# Patient Record
Sex: Male | Born: 1945
Health system: Southern US, Community
[De-identification: ages and names within clinical notes are randomized; demographics above are authoritative.]

## PROBLEM LIST (undated history)

## (undated) DIAGNOSIS — M1990 Unspecified osteoarthritis, unspecified site: Secondary | ICD-10-CM

## (undated) DIAGNOSIS — G4762 Sleep related leg cramps: Secondary | ICD-10-CM

## (undated) DIAGNOSIS — Z85828 Personal history of other malignant neoplasm of skin: Secondary | ICD-10-CM

## (undated) DIAGNOSIS — C801 Malignant (primary) neoplasm, unspecified: Secondary | ICD-10-CM

## (undated) DIAGNOSIS — M109 Gout, unspecified: Secondary | ICD-10-CM

## (undated) HISTORY — PX: JOINT REPLACEMENT: SHX530

---

## 1991-10-08 HISTORY — PX: KNEE ARTHROSCOPY: SUR90

## 1991-10-08 HISTORY — PX: ANKLE ARTHROTOMY: SUR101

## 2012-08-18 DIAGNOSIS — Z23 Encounter for immunization: Secondary | ICD-10-CM | POA: Diagnosis not present

## 2013-01-25 DIAGNOSIS — L821 Other seborrheic keratosis: Secondary | ICD-10-CM | POA: Diagnosis not present

## 2013-01-25 DIAGNOSIS — L910 Hypertrophic scar: Secondary | ICD-10-CM | POA: Diagnosis not present

## 2013-01-25 DIAGNOSIS — C44611 Basal cell carcinoma of skin of unspecified upper limb, including shoulder: Secondary | ICD-10-CM | POA: Diagnosis not present

## 2013-01-25 DIAGNOSIS — D485 Neoplasm of uncertain behavior of skin: Secondary | ICD-10-CM | POA: Diagnosis not present

## 2013-01-25 DIAGNOSIS — L570 Actinic keratosis: Secondary | ICD-10-CM | POA: Diagnosis not present

## 2013-02-18 DIAGNOSIS — C44519 Basal cell carcinoma of skin of other part of trunk: Secondary | ICD-10-CM | POA: Diagnosis not present

## 2013-02-18 DIAGNOSIS — D485 Neoplasm of uncertain behavior of skin: Secondary | ICD-10-CM | POA: Diagnosis not present

## 2013-02-18 DIAGNOSIS — C4441 Basal cell carcinoma of skin of scalp and neck: Secondary | ICD-10-CM | POA: Diagnosis not present

## 2013-02-18 DIAGNOSIS — C44611 Basal cell carcinoma of skin of unspecified upper limb, including shoulder: Secondary | ICD-10-CM | POA: Diagnosis not present

## 2013-03-22 DIAGNOSIS — S51809A Unspecified open wound of unspecified forearm, initial encounter: Secondary | ICD-10-CM | POA: Diagnosis not present

## 2013-03-22 DIAGNOSIS — R030 Elevated blood-pressure reading, without diagnosis of hypertension: Secondary | ICD-10-CM | POA: Diagnosis not present

## 2013-04-07 DIAGNOSIS — C4441 Basal cell carcinoma of skin of scalp and neck: Secondary | ICD-10-CM | POA: Diagnosis not present

## 2013-04-07 DIAGNOSIS — L821 Other seborrheic keratosis: Secondary | ICD-10-CM | POA: Diagnosis not present

## 2013-07-02 DIAGNOSIS — Z23 Encounter for immunization: Secondary | ICD-10-CM | POA: Diagnosis not present

## 2013-09-16 DIAGNOSIS — L570 Actinic keratosis: Secondary | ICD-10-CM | POA: Diagnosis not present

## 2013-09-16 DIAGNOSIS — D485 Neoplasm of uncertain behavior of skin: Secondary | ICD-10-CM | POA: Diagnosis not present

## 2013-09-16 DIAGNOSIS — C44611 Basal cell carcinoma of skin of unspecified upper limb, including shoulder: Secondary | ICD-10-CM | POA: Diagnosis not present

## 2013-09-16 DIAGNOSIS — Z85828 Personal history of other malignant neoplasm of skin: Secondary | ICD-10-CM | POA: Diagnosis not present

## 2013-09-16 DIAGNOSIS — L259 Unspecified contact dermatitis, unspecified cause: Secondary | ICD-10-CM | POA: Diagnosis not present

## 2013-09-16 DIAGNOSIS — C44519 Basal cell carcinoma of skin of other part of trunk: Secondary | ICD-10-CM | POA: Diagnosis not present

## 2013-10-12 DIAGNOSIS — H0230 Blepharochalasis unspecified eye, unspecified eyelid: Secondary | ICD-10-CM | POA: Diagnosis not present

## 2013-10-12 DIAGNOSIS — H04129 Dry eye syndrome of unspecified lacrimal gland: Secondary | ICD-10-CM | POA: Diagnosis not present

## 2013-10-12 DIAGNOSIS — H2510 Age-related nuclear cataract, unspecified eye: Secondary | ICD-10-CM | POA: Diagnosis not present

## 2013-10-12 DIAGNOSIS — H1045 Other chronic allergic conjunctivitis: Secondary | ICD-10-CM | POA: Diagnosis not present

## 2013-11-09 DIAGNOSIS — C44611 Basal cell carcinoma of skin of unspecified upper limb, including shoulder: Secondary | ICD-10-CM | POA: Diagnosis not present

## 2013-11-09 DIAGNOSIS — C4491 Basal cell carcinoma of skin, unspecified: Secondary | ICD-10-CM | POA: Diagnosis not present

## 2013-11-09 DIAGNOSIS — C44519 Basal cell carcinoma of skin of other part of trunk: Secondary | ICD-10-CM | POA: Diagnosis not present

## 2014-05-31 DIAGNOSIS — M1610 Unilateral primary osteoarthritis, unspecified hip: Secondary | ICD-10-CM | POA: Diagnosis not present

## 2014-05-31 DIAGNOSIS — R109 Unspecified abdominal pain: Secondary | ICD-10-CM | POA: Diagnosis not present

## 2014-05-31 DIAGNOSIS — M25559 Pain in unspecified hip: Secondary | ICD-10-CM | POA: Diagnosis not present

## 2014-06-21 DIAGNOSIS — C44529 Squamous cell carcinoma of skin of other part of trunk: Secondary | ICD-10-CM | POA: Diagnosis not present

## 2014-06-21 DIAGNOSIS — C44611 Basal cell carcinoma of skin of unspecified upper limb, including shoulder: Secondary | ICD-10-CM | POA: Diagnosis not present

## 2014-06-21 DIAGNOSIS — D485 Neoplasm of uncertain behavior of skin: Secondary | ICD-10-CM | POA: Diagnosis not present

## 2014-06-21 DIAGNOSIS — C44319 Basal cell carcinoma of skin of other parts of face: Secondary | ICD-10-CM | POA: Diagnosis not present

## 2014-06-21 DIAGNOSIS — C44221 Squamous cell carcinoma of skin of unspecified ear and external auricular canal: Secondary | ICD-10-CM | POA: Diagnosis not present

## 2014-06-21 DIAGNOSIS — L821 Other seborrheic keratosis: Secondary | ICD-10-CM | POA: Diagnosis not present

## 2014-06-21 DIAGNOSIS — Z85828 Personal history of other malignant neoplasm of skin: Secondary | ICD-10-CM | POA: Diagnosis not present

## 2014-07-04 DIAGNOSIS — M25559 Pain in unspecified hip: Secondary | ICD-10-CM | POA: Diagnosis not present

## 2014-07-04 DIAGNOSIS — R109 Unspecified abdominal pain: Secondary | ICD-10-CM | POA: Diagnosis not present

## 2014-07-04 DIAGNOSIS — M1610 Unilateral primary osteoarthritis, unspecified hip: Secondary | ICD-10-CM | POA: Diagnosis not present

## 2014-07-21 DIAGNOSIS — Z23 Encounter for immunization: Secondary | ICD-10-CM | POA: Diagnosis not present

## 2014-07-27 DIAGNOSIS — M1611 Unilateral primary osteoarthritis, right hip: Secondary | ICD-10-CM | POA: Diagnosis not present

## 2014-07-27 DIAGNOSIS — M1612 Unilateral primary osteoarthritis, left hip: Secondary | ICD-10-CM | POA: Diagnosis not present

## 2014-08-03 DIAGNOSIS — C44319 Basal cell carcinoma of skin of other parts of face: Secondary | ICD-10-CM | POA: Diagnosis not present

## 2014-08-03 DIAGNOSIS — C44310 Basal cell carcinoma of skin of unspecified parts of face: Secondary | ICD-10-CM | POA: Diagnosis not present

## 2014-08-09 DIAGNOSIS — C44222 Squamous cell carcinoma of skin of right ear and external auricular canal: Secondary | ICD-10-CM | POA: Diagnosis not present

## 2014-08-10 DIAGNOSIS — M1612 Unilateral primary osteoarthritis, left hip: Secondary | ICD-10-CM | POA: Diagnosis not present

## 2014-08-10 DIAGNOSIS — M25552 Pain in left hip: Secondary | ICD-10-CM | POA: Diagnosis not present

## 2014-08-17 DIAGNOSIS — C44529 Squamous cell carcinoma of skin of other part of trunk: Secondary | ICD-10-CM | POA: Diagnosis not present

## 2014-08-17 DIAGNOSIS — C44612 Basal cell carcinoma of skin of right upper limb, including shoulder: Secondary | ICD-10-CM | POA: Diagnosis not present

## 2014-08-22 DIAGNOSIS — M1990 Unspecified osteoarthritis, unspecified site: Secondary | ICD-10-CM | POA: Diagnosis not present

## 2014-08-22 DIAGNOSIS — M25551 Pain in right hip: Secondary | ICD-10-CM | POA: Diagnosis not present

## 2014-08-23 ENCOUNTER — Other Ambulatory Visit (HOSPITAL_COMMUNITY): Payer: Self-pay | Admitting: Orthopaedic Surgery

## 2014-08-26 ENCOUNTER — Other Ambulatory Visit (HOSPITAL_COMMUNITY): Payer: Self-pay | Admitting: *Deleted

## 2014-08-26 NOTE — Patient Instructions (Addendum)
Jose Robles  08/26/2014                           YOUR PROCEDURE IS SCHEDULED ON: 09/02/14                ENTER FROM [address] - GO TO PARKING DECK               LOOK FOR VALET PARKING  / [employer]ARTS                              FOLLOW  SIGNS TO SHORT STAY CENTER                 ARRIVE AT SHORT STAY AT: 7:45 AM               CALL THIS NUMBER IF ANY PROBLEMS THE DAY OF SURGERY :               [phone]                                REMEMBER:   Do not eat food or drink liquids AFTER MIDNIGHT                  Take these medicines the morning of surgery with               A SIPS OF WATER :   NONE      Do not wear jewelry, make-up   Do not wear lotions, powders, or perfumes.   Do not shave legs or underarms 12 hrs. before surgery (men may shave face)  Do not bring valuables to the hospital.  Contacts, dentures or bridgework may not be worn into surgery.  Leave suitcase in the car. After surgery it may be brought to your room.  For patients admitted to the hospital more than one night, checkout time is            11:00 AM                                                     ________________________________________________________________________                                                                                                  [place]  Before surgery, you can play an important role.  Because skin is not sterile, your skin needs to be as free of germs as possible.  You can reduce the number of germs on your skin by washing with CHG (chlorahexidine gluconate) soap before surgery.  CHG is an antiseptic cleaner which kills germs and bonds with the skin to continue killing germs even after washing. Please DO NOT use if you have an allergy to  CHG or antibacterial soaps.  If your skin becomes reddened/irritated stop using the CHG and inform your nurse when you arrive at Short Stay. Do not shave (including legs and underarms)  for at least 48 hours prior to the first CHG shower.  You may shave your face. Please follow these instructions carefully:   1.  Shower with CHG Soap the night before surgery and the  morning of Surgery.   2.  If you choose to wash your hair, wash your hair first as usual with your  normal  Shampoo.   3.  After you shampoo, rinse your hair and body thoroughly to remove the  shampoo.                                         4.  Use CHG as you would any other liquid soap.  You can apply chg directly  to the skin and wash . Gently wash with scrungie or clean wascloth    5.  Apply the CHG Soap to your body ONLY FROM THE NECK DOWN.   Do not use on open                           Wound or open sores. Avoid contact with eyes, ears mouth and genitals (private parts).                        Genitals (private parts) with your normal soap.              6.  Wash thoroughly, paying special attention to the area where your surgery  will be performed.   7.  Thoroughly rinse your body with warm water from the neck down.   8.  DO NOT shower/wash with your normal soap after using and rinsing off  the CHG Soap .                9.  Pat yourself dry with a clean towel.             10.  Wear clean pajamas.             11.  Place clean sheets on your bed the night of your first shower and do not  sleep with pets.  Day of Surgery : Do not apply any lotions/deodorants the morning of surgery.  Please wear clean clothes to the hospital/surgery center.  FAILURE TO FOLLOW THESE INSTRUCTIONS MAY RESULT IN THE CANCELLATION OF YOUR SURGERY    PATIENT SIGNATURE_________________________________  ______________________________________________________________________

## 2014-08-29 ENCOUNTER — Encounter (HOSPITAL_COMMUNITY): Payer: Self-pay

## 2014-08-29 ENCOUNTER — Encounter (HOSPITAL_COMMUNITY)
Admission: RE | Admit: 2014-08-29 | Discharge: 2014-08-29 | Disposition: A | Discharge status: Home / Self Care | Payer: Medicare Other | Source: Ambulatory Visit | Attending: Orthopaedic Surgery | Admitting: Orthopaedic Surgery

## 2014-08-29 DIAGNOSIS — Z01812 Encounter for preprocedural laboratory examination: Secondary | ICD-10-CM | POA: Diagnosis not present

## 2014-08-29 HISTORY — DX: Unspecified osteoarthritis, unspecified site: M19.90

## 2014-08-29 HISTORY — DX: Personal history of other malignant neoplasm of skin: Z85.828

## 2014-08-29 HISTORY — DX: Gout, unspecified: M10.9

## 2014-08-29 HISTORY — DX: Malignant (primary) neoplasm, unspecified: C80.1

## 2014-08-29 LAB — URINALYSIS, ROUTINE W REFLEX MICROSCOPIC
Bilirubin Urine: NEGATIVE
Glucose, UA: NEGATIVE mg/dL
Hgb urine dipstick: NEGATIVE
Ketones, ur: NEGATIVE mg/dL
LEUKOCYTES UA: NEGATIVE
Nitrite: NEGATIVE
PROTEIN: NEGATIVE mg/dL
Specific Gravity, Urine: 1.008 (ref 1.005–1.030)
Urobilinogen, UA: 0.2 mg/dL (ref 0.0–1.0)
pH: 6 (ref 5.0–8.0)

## 2014-08-29 LAB — PROTIME-INR
INR: 0.98 (ref 0.00–1.49)
PROTHROMBIN TIME: 13 s (ref 11.6–15.2)

## 2014-08-29 LAB — CBC
HCT: 44.4 % (ref 39.0–52.0)
Hemoglobin: 15.2 g/dL (ref 13.0–17.0)
MCH: 33.5 pg (ref 26.0–34.0)
MCHC: 34.2 g/dL (ref 30.0–36.0)
MCV: 97.8 fL (ref 78.0–100.0)
Platelets: 183 10*3/uL (ref 150–400)
RBC: 4.54 MIL/uL (ref 4.22–5.81)
RDW: 12.1 % (ref 11.5–15.5)
WBC: 9.2 10*3/uL (ref 4.0–10.5)

## 2014-08-29 LAB — BASIC METABOLIC PANEL
Anion gap: 12 (ref 5–15)
BUN: 12 mg/dL (ref 6–23)
CHLORIDE: 103 meq/L (ref 96–112)
CO2: 26 mEq/L (ref 19–32)
CREATININE: 0.87 mg/dL (ref 0.50–1.35)
Calcium: 9.9 mg/dL (ref 8.4–10.5)
GFR calc non Af Amer: 87 mL/min — ABNORMAL LOW (ref >90)
Glucose, Bld: 113 mg/dL — ABNORMAL HIGH (ref 70–99)
Potassium: 4.4 mEq/L (ref 3.7–5.3)
Sodium: 141 mEq/L (ref 137–147)

## 2014-08-29 LAB — SURGICAL PCR SCREEN
MRSA, PCR: NEGATIVE
STAPHYLOCOCCUS AUREUS: NEGATIVE

## 2014-08-29 LAB — APTT: APTT: 26 s (ref 24–37)

## 2014-08-29 NOTE — Progress Notes (Signed)
   08/29/14 1503  OBSTRUCTIVE SLEEP APNEA  Have you ever been diagnosed with sleep apnea through a sleep study? No  Do you snore loudly (loud enough to be heard through closed doors)?  1  Do you often feel tired, fatigued, or sleepy during the daytime? 1  Has anyone observed you stop breathing during your sleep? 0  Do you have, or are you being treated for high blood pressure? 0  BMI more than 35 kg/m2? 0  Age over 67 years old? 1  Neck circumference greater than 40 cm/16 inches? 1  Gender: 1  Obstructive Sleep Apnea Score 5  Score 4 or greater  Results sent to PCP

## 2014-09-02 ENCOUNTER — Inpatient Hospital Stay (HOSPITAL_COMMUNITY)
Admission: RE | Admit: 2014-09-02 | Discharge: 2014-09-04 | DRG: 462 | Disposition: A | Discharge status: Home Health | Payer: Medicare Other | Source: Ambulatory Visit | Attending: Orthopaedic Surgery | Admitting: Orthopaedic Surgery

## 2014-09-02 ENCOUNTER — Inpatient Hospital Stay (HOSPITAL_COMMUNITY): Payer: Medicare Other

## 2014-09-02 ENCOUNTER — Encounter (HOSPITAL_COMMUNITY)
Admission: RE | Disposition: A | Discharge status: Home Health | Payer: Self-pay | Source: Ambulatory Visit | Attending: Orthopaedic Surgery

## 2014-09-02 ENCOUNTER — Inpatient Hospital Stay (HOSPITAL_COMMUNITY): Payer: Medicare Other | Admitting: Anesthesiology

## 2014-09-02 ENCOUNTER — Encounter (HOSPITAL_COMMUNITY): Payer: Self-pay

## 2014-09-02 DIAGNOSIS — Z96641 Presence of right artificial hip joint: Secondary | ICD-10-CM | POA: Diagnosis not present

## 2014-09-02 DIAGNOSIS — Z87891 Personal history of nicotine dependence: Secondary | ICD-10-CM

## 2014-09-02 DIAGNOSIS — Z880 Allergy status to penicillin: Secondary | ICD-10-CM | POA: Diagnosis not present

## 2014-09-02 DIAGNOSIS — M169 Osteoarthritis of hip, unspecified: Secondary | ICD-10-CM | POA: Diagnosis not present

## 2014-09-02 DIAGNOSIS — M25752 Osteophyte, left hip: Secondary | ICD-10-CM | POA: Diagnosis present

## 2014-09-02 DIAGNOSIS — M109 Gout, unspecified: Secondary | ICD-10-CM | POA: Diagnosis present

## 2014-09-02 DIAGNOSIS — Z881 Allergy status to other antibiotic agents status: Secondary | ICD-10-CM | POA: Diagnosis not present

## 2014-09-02 DIAGNOSIS — Z471 Aftercare following joint replacement surgery: Secondary | ICD-10-CM | POA: Diagnosis not present

## 2014-09-02 DIAGNOSIS — Z96643 Presence of artificial hip joint, bilateral: Secondary | ICD-10-CM

## 2014-09-02 DIAGNOSIS — M160 Bilateral primary osteoarthritis of hip: Secondary | ICD-10-CM | POA: Diagnosis not present

## 2014-09-02 DIAGNOSIS — M25452 Effusion, left hip: Secondary | ICD-10-CM | POA: Diagnosis present

## 2014-09-02 DIAGNOSIS — Z01812 Encounter for preprocedural laboratory examination: Secondary | ICD-10-CM

## 2014-09-02 DIAGNOSIS — M1990 Unspecified osteoarthritis, unspecified site: Secondary | ICD-10-CM | POA: Diagnosis not present

## 2014-09-02 DIAGNOSIS — Z96642 Presence of left artificial hip joint: Secondary | ICD-10-CM | POA: Diagnosis not present

## 2014-09-02 DIAGNOSIS — G8918 Other acute postprocedural pain: Secondary | ICD-10-CM | POA: Diagnosis not present

## 2014-09-02 DIAGNOSIS — Z85828 Personal history of other malignant neoplasm of skin: Secondary | ICD-10-CM

## 2014-09-02 DIAGNOSIS — M25551 Pain in right hip: Secondary | ICD-10-CM | POA: Diagnosis not present

## 2014-09-02 DIAGNOSIS — M25552 Pain in left hip: Secondary | ICD-10-CM | POA: Diagnosis not present

## 2014-09-02 DIAGNOSIS — Z419 Encounter for procedure for purposes other than remedying health state, unspecified: Secondary | ICD-10-CM

## 2014-09-02 HISTORY — PX: BILATERAL ANTERIOR TOTAL HIP ARTHROPLASTY: SHX5567

## 2014-09-02 HISTORY — DX: Sleep related leg cramps: G47.62

## 2014-09-02 LAB — TYPE AND SCREEN
ABO/RH(D): A POS
ANTIBODY SCREEN: NEGATIVE

## 2014-09-02 LAB — ABO/RH: ABO/RH(D): A POS

## 2014-09-02 SURGERY — ARTHROPLASTY, HIP, BILATERAL, TOTAL, ANTERIOR APPROACH
Anesthesia: Epidural | Site: Hip | Laterality: Bilateral

## 2014-09-02 MED ORDER — FENTANYL CITRATE 0.05 MG/ML IJ SOLN
INTRAMUSCULAR | Status: DC | PRN
  Administered 2014-09-02 (×2): 50 ug via INTRAVENOUS

## 2014-09-02 MED ORDER — METOCLOPRAMIDE HCL 5 MG/ML IJ SOLN: 5.0000 mg | Freq: Three times a day (TID) | INTRAMUSCULAR | Status: DC | PRN

## 2014-09-02 MED ORDER — HYDROMORPHONE HCL 1 MG/ML IJ SOLN: 1.0000 mg | INTRAMUSCULAR | Status: DC | PRN

## 2014-09-02 MED ORDER — METOCLOPRAMIDE HCL 10 MG PO TABS: 5.0000 mg | ORAL_TABLET | Freq: Three times a day (TID) | ORAL | Status: DC | PRN

## 2014-09-02 MED ORDER — LACTATED RINGERS IV SOLN
INTRAVENOUS | Status: DC
  Administered 2014-09-02: 1000 mL via INTRAVENOUS

## 2014-09-02 MED ORDER — KETOROLAC TROMETHAMINE 30 MG/ML IJ SOLN: 30.0000 mg | Freq: Four times a day (QID) | INTRAMUSCULAR | Status: AC | PRN

## 2014-09-02 MED ORDER — DEXTROSE 5 % IV SOLN: 1.0000 ug/kg/h | INTRAVENOUS | Status: DC | PRN

## 2014-09-02 MED ORDER — NALBUPHINE HCL 10 MG/ML IJ SOLN: 5.0000 mg | Freq: Once | INTRAMUSCULAR | Status: AC | PRN

## 2014-09-02 MED ORDER — ONDANSETRON HCL 4 MG/2ML IJ SOLN
INTRAMUSCULAR | Status: AC
  Filled 2014-09-02: qty 2

## 2014-09-02 MED ORDER — METHOCARBAMOL 500 MG PO TABS
500.0000 mg | ORAL_TABLET | Freq: Four times a day (QID) | ORAL | Status: DC | PRN
  Administered 2014-09-04 (×2): 500 mg via ORAL
  Filled 2014-09-02 (×3): qty 1

## 2014-09-02 MED ORDER — POLYETHYLENE GLYCOL 3350 17 G PO PACK
17.0000 g | PACK | Freq: Every day | ORAL | Status: DC | PRN
  Filled 2014-09-02: qty 1

## 2014-09-02 MED ORDER — PROPOFOL 10 MG/ML IV BOLUS
INTRAVENOUS | Status: DC | PRN
  Administered 2014-09-02: 20 mg via INTRAVENOUS

## 2014-09-02 MED ORDER — DOCUSATE SODIUM 100 MG PO CAPS
100.0000 mg | ORAL_CAPSULE | Freq: Two times a day (BID) | ORAL | Status: DC
  Administered 2014-09-02 – 2014-09-04 (×4): 100 mg via ORAL
  Filled 2014-09-02 (×4): qty 1

## 2014-09-02 MED ORDER — PROPOFOL 10 MG/ML IV BOLUS
INTRAVENOUS | Status: AC
  Filled 2014-09-02: qty 20

## 2014-09-02 MED ORDER — ZOLPIDEM TARTRATE 5 MG PO TABS
5.0000 mg | ORAL_TABLET | Freq: Every evening | ORAL | Status: DC | PRN
  Administered 2014-09-03: 5 mg via ORAL
  Filled 2014-09-02: qty 1

## 2014-09-02 MED ORDER — CEFAZOLIN SODIUM 1-5 GM-% IV SOLN
1.0000 g | Freq: Four times a day (QID) | INTRAVENOUS | Status: AC
  Administered 2014-09-02 (×2): 1 g via INTRAVENOUS
  Filled 2014-09-02 (×2): qty 50

## 2014-09-02 MED ORDER — MIDAZOLAM HCL 2 MG/2ML IJ SOLN
INTRAMUSCULAR | Status: AC
  Filled 2014-09-02: qty 2

## 2014-09-02 MED ORDER — MENTHOL 3 MG MT LOZG: 1.0000 | LOZENGE | OROMUCOSAL | Status: DC | PRN

## 2014-09-02 MED ORDER — CEFAZOLIN SODIUM-DEXTROSE 2-3 GM-% IV SOLR
2.0000 g | INTRAVENOUS | Status: AC
  Administered 2014-09-02: 2 g via INTRAVENOUS

## 2014-09-02 MED ORDER — 0.9 % SODIUM CHLORIDE (POUR BTL) OPTIME
TOPICAL | Status: DC | PRN
  Administered 2014-09-02: 1000 mL

## 2014-09-02 MED ORDER — DEXAMETHASONE SODIUM PHOSPHATE 10 MG/ML IJ SOLN
INTRAMUSCULAR | Status: DC | PRN
  Administered 2014-09-02: 10 mg via INTRAVENOUS

## 2014-09-02 MED ORDER — ONDANSETRON HCL 4 MG/2ML IJ SOLN: 4.0000 mg | Freq: Three times a day (TID) | INTRAMUSCULAR | Status: DC | PRN

## 2014-09-02 MED ORDER — HYDROMORPHONE HCL 1 MG/ML IJ SOLN
0.2500 mg | INTRAMUSCULAR | Status: DC | PRN
  Administered 2014-09-02 (×2): 0.25 mg via INTRAVENOUS

## 2014-09-02 MED ORDER — DEXAMETHASONE SODIUM PHOSPHATE 10 MG/ML IJ SOLN
INTRAMUSCULAR | Status: AC
  Filled 2014-09-02: qty 1

## 2014-09-02 MED ORDER — ACETAMINOPHEN 325 MG PO TABS
650.0000 mg | ORAL_TABLET | Freq: Four times a day (QID) | ORAL | Status: DC | PRN
  Administered 2014-09-03 – 2014-09-04 (×2): 650 mg via ORAL
  Filled 2014-09-02 (×2): qty 2

## 2014-09-02 MED ORDER — PHENYLEPHRINE HCL 10 MG/ML IJ SOLN
INTRAMUSCULAR | Status: DC | PRN
  Administered 2014-09-02 (×3): 80 ug via INTRAVENOUS

## 2014-09-02 MED ORDER — OXYCODONE HCL 5 MG PO TABS
5.0000 mg | ORAL_TABLET | ORAL | Status: DC | PRN
  Administered 2014-09-03 – 2014-09-04 (×3): 10 mg via ORAL
  Filled 2014-09-02 (×4): qty 2

## 2014-09-02 MED ORDER — ONDANSETRON HCL 4 MG PO TABS: 4.0000 mg | ORAL_TABLET | Freq: Four times a day (QID) | ORAL | Status: DC | PRN

## 2014-09-02 MED ORDER — BUPIVACAINE HCL (PF) 0.5 % IJ SOLN
INTRAMUSCULAR | Status: DC | PRN
  Administered 2014-09-02: 3 mL

## 2014-09-02 MED ORDER — EPHEDRINE SULFATE 50 MG/ML IJ SOLN
INTRAMUSCULAR | Status: DC | PRN
  Administered 2014-09-02: 10 mg via INTRAVENOUS

## 2014-09-02 MED ORDER — PROPOFOL INFUSION 10 MG/ML OPTIME
INTRAVENOUS | Status: DC | PRN
  Administered 2014-09-02: 75 ug/kg/min via INTRAVENOUS

## 2014-09-02 MED ORDER — BUPIVACAINE HCL (PF) 0.5 % IJ SOLN
INTRAMUSCULAR | Status: AC
  Filled 2014-09-02: qty 30

## 2014-09-02 MED ORDER — IBUPROFEN 200 MG PO TABS
600.0000 mg | ORAL_TABLET | Freq: Four times a day (QID) | ORAL | Status: DC | PRN
  Filled 2014-09-02: qty 1

## 2014-09-02 MED ORDER — DIPHENHYDRAMINE HCL 25 MG PO CAPS: 25.0000 mg | ORAL_CAPSULE | ORAL | Status: DC | PRN

## 2014-09-02 MED ORDER — ACETAMINOPHEN 650 MG RE SUPP: 650.0000 mg | Freq: Four times a day (QID) | RECTAL | Status: DC | PRN

## 2014-09-02 MED ORDER — SODIUM CHLORIDE 0.9 % IJ SOLN: 3.0000 mL | INTRAMUSCULAR | Status: DC | PRN

## 2014-09-02 MED ORDER — SODIUM CHLORIDE 0.9 % IV SOLN
INTRAVENOUS | Status: DC
  Administered 2014-09-02 – 2014-09-03 (×2): via EPIDURAL
  Filled 2014-09-02 (×14): qty 20

## 2014-09-02 MED ORDER — ASPIRIN EC 325 MG PO TBEC
325.0000 mg | DELAYED_RELEASE_TABLET | Freq: Two times a day (BID) | ORAL | Status: DC
  Administered 2014-09-03 – 2014-09-04 (×3): 325 mg via ORAL
  Filled 2014-09-02 (×5): qty 1

## 2014-09-02 MED ORDER — NALBUPHINE HCL 10 MG/ML IJ SOLN
5.0000 mg | INTRAMUSCULAR | Status: DC | PRN
  Filled 2014-09-02: qty 0.5

## 2014-09-02 MED ORDER — SCOPOLAMINE 1 MG/3DAYS TD PT72
1.0000 | MEDICATED_PATCH | Freq: Once | TRANSDERMAL | Status: DC
  Filled 2014-09-02: qty 1

## 2014-09-02 MED ORDER — FERROUS SULFATE 325 (65 FE) MG PO TABS
325.0000 mg | ORAL_TABLET | Freq: Three times a day (TID) | ORAL | Status: DC
  Administered 2014-09-02 – 2014-09-04 (×6): 325 mg via ORAL
  Filled 2014-09-02 (×8): qty 1

## 2014-09-02 MED ORDER — MIDAZOLAM HCL 5 MG/5ML IJ SOLN
INTRAMUSCULAR | Status: DC | PRN
  Administered 2014-09-02: 2 mg via INTRAVENOUS

## 2014-09-02 MED ORDER — DIPHENHYDRAMINE HCL 50 MG/ML IJ SOLN: 12.5000 mg | INTRAMUSCULAR | Status: DC | PRN

## 2014-09-02 MED ORDER — LIDOCAINE-EPINEPHRINE (PF) 2 %-1:200000 IJ SOLN
INTRAMUSCULAR | Status: AC
  Filled 2014-09-02: qty 20

## 2014-09-02 MED ORDER — MEPERIDINE HCL 50 MG/ML IJ SOLN: 6.2500 mg | INTRAMUSCULAR | Status: DC | PRN

## 2014-09-02 MED ORDER — NALOXONE HCL 0.4 MG/ML IJ SOLN: 0.4000 mg | INTRAMUSCULAR | Status: DC | PRN

## 2014-09-02 MED ORDER — PHENYLEPHRINE 40 MCG/ML (10ML) SYRINGE FOR IV PUSH (FOR BLOOD PRESSURE SUPPORT)
PREFILLED_SYRINGE | INTRAVENOUS | Status: AC
  Filled 2014-09-02: qty 10

## 2014-09-02 MED ORDER — SODIUM CHLORIDE 0.9 % IV SOLN
INTRAVENOUS | Status: DC
  Administered 2014-09-02 – 2014-09-03 (×3): via INTRAVENOUS

## 2014-09-02 MED ORDER — PHENYLEPHRINE HCL 10 MG/ML IJ SOLN
10.0000 mg | INTRAVENOUS | Status: DC | PRN
  Administered 2014-09-02: 50 ug/min via INTRAVENOUS

## 2014-09-02 MED ORDER — TRANEXAMIC ACID 100 MG/ML IV SOLN
1000.0000 mg | INTRAVENOUS | Status: AC
  Administered 2014-09-02: 1000 mg via INTRAVENOUS
  Filled 2014-09-02: qty 10

## 2014-09-02 MED ORDER — CEFAZOLIN SODIUM-DEXTROSE 2-3 GM-% IV SOLR
INTRAVENOUS | Status: AC
  Filled 2014-09-02: qty 50

## 2014-09-02 MED ORDER — ONDANSETRON HCL 4 MG/2ML IJ SOLN: 4.0000 mg | Freq: Four times a day (QID) | INTRAMUSCULAR | Status: DC | PRN

## 2014-09-02 MED ORDER — PHENYLEPHRINE HCL 10 MG/ML IJ SOLN
INTRAMUSCULAR | Status: AC
  Filled 2014-09-02: qty 1

## 2014-09-02 MED ORDER — SODIUM CHLORIDE 0.9 % IR SOLN
Status: DC | PRN
  Administered 2014-09-02 (×2): 1000 mL

## 2014-09-02 MED ORDER — FENTANYL CITRATE 0.05 MG/ML IJ SOLN
INTRAMUSCULAR | Status: AC
  Filled 2014-09-02: qty 2

## 2014-09-02 MED ORDER — LACTATED RINGERS IV SOLN
INTRAVENOUS | Status: DC | PRN
  Administered 2014-09-02 (×2): via INTRAVENOUS

## 2014-09-02 MED ORDER — LACTATED RINGERS IV SOLN: INTRAVENOUS | Status: DC

## 2014-09-02 MED ORDER — PHENOL 1.4 % MT LIQD: 1.0000 | OROMUCOSAL | Status: DC | PRN

## 2014-09-02 MED ORDER — DEXTROSE 5 % IV SOLN
500.0000 mg | Freq: Four times a day (QID) | INTRAVENOUS | Status: DC | PRN
  Administered 2014-09-02 – 2014-09-03 (×2): 500 mg via INTRAVENOUS
  Filled 2014-09-02 (×3): qty 5

## 2014-09-02 MED ORDER — ACETAMINOPHEN 500 MG PO TABS
1000.0000 mg | ORAL_TABLET | Freq: Four times a day (QID) | ORAL | Status: AC
  Administered 2014-09-02 – 2014-09-03 (×4): 1000 mg via ORAL
  Filled 2014-09-02 (×4): qty 2

## 2014-09-02 MED ORDER — LIDOCAINE HCL (CARDIAC) 20 MG/ML IV SOLN
INTRAVENOUS | Status: DC | PRN
  Administered 2014-09-02: 20 mg via INTRAVENOUS

## 2014-09-02 MED ORDER — HYDROMORPHONE HCL 1 MG/ML IJ SOLN
INTRAMUSCULAR | Status: AC
  Filled 2014-09-02: qty 1

## 2014-09-02 SURGICAL SUPPLY — 40 items
BAG ZIPLOCK 12X15 (MISCELLANEOUS) ×4 IMPLANT
BLADE SAW SGTL 18X1.27X75 (BLADE) ×4 IMPLANT
BLADE SURG SZ10 CARB STEEL (BLADE) ×4 IMPLANT
CAPT HIP PF COP ×4 IMPLANT
CELLS DAT CNTRL 66122 CELL SVR (MISCELLANEOUS) ×2 IMPLANT
COVER PERINEAL POST (MISCELLANEOUS) ×2 IMPLANT
DRAPE C-ARM 42X120 X-RAY (DRAPES) ×4 IMPLANT
DRAPE STERI IOBAN 125X83 (DRAPES) ×4 IMPLANT
DRSG AQUACEL AG ADV 3.5X10 (GAUZE/BANDAGES/DRESSINGS) ×4 IMPLANT
DRSG XEROFORM 1X8 (GAUZE/BANDAGES/DRESSINGS) ×4 IMPLANT
DURAPREP 26ML APPLICATOR (WOUND CARE) ×4 IMPLANT
ELECT BLADE TIP CTD 4 INCH (ELECTRODE) ×4 IMPLANT
ELECT REM PT RETURN 9FT ADLT (ELECTROSURGICAL) ×4
ELECTRODE REM PT RTRN 9FT ADLT (ELECTROSURGICAL) ×2 IMPLANT
EVACUATOR 1/8 PVC DRAIN (DRAIN) IMPLANT
FACESHIELD WRAPAROUND (MASK) ×12 IMPLANT
GLOVE BIO SURGEON STRL SZ7.5 (GLOVE) ×8 IMPLANT
GLOVE BIOGEL PI IND STRL 8 (GLOVE) ×4 IMPLANT
GLOVE BIOGEL PI INDICATOR 8 (GLOVE) ×4
GLOVE ECLIPSE 8.0 STRL XLNG CF (GLOVE) ×8 IMPLANT
GOWN STRL REUS W/TWL XL LVL3 (GOWN DISPOSABLE) ×4 IMPLANT
HANDPIECE INTERPULSE COAX TIP (DISPOSABLE) ×2
KIT BASIN OR (CUSTOM PROCEDURE TRAY) ×2 IMPLANT
LINER BOOT UNIVERSAL DISP (MISCELLANEOUS) ×2 IMPLANT
LIQUID BAND (GAUZE/BANDAGES/DRESSINGS) IMPLANT
PACK TOTAL JOINT (CUSTOM PROCEDURE TRAY) ×2 IMPLANT
PADDING CAST COTTON 6X4 STRL (CAST SUPPLIES) ×2 IMPLANT
PENCIL BUTTON HOLSTER BLD 10FT (ELECTRODE) ×4 IMPLANT
RTRCTR WOUND ALEXIS 18CM MED (MISCELLANEOUS) ×4
SET HNDPC FAN SPRY TIP SCT (DISPOSABLE) ×2 IMPLANT
STAPLER VISISTAT 35W (STAPLE) ×2 IMPLANT
SUT ETHIBOND NAB CT1 #1 30IN (SUTURE) ×4 IMPLANT
SUT MNCRL AB 4-0 PS2 18 (SUTURE) IMPLANT
SUT VIC AB 0 CT1 36 (SUTURE) ×4 IMPLANT
SUT VIC AB 1 CT1 36 (SUTURE) ×8 IMPLANT
SUT VIC AB 2-0 CT1 27 (SUTURE) ×2
SUT VIC AB 2-0 CT1 TAPERPNT 27 (SUTURE) ×2 IMPLANT
TOWEL OR 17X26 10 PK STRL BLUE (TOWEL DISPOSABLE) ×4 IMPLANT
TRAY FOLEY CATH 16FRSI W/METER (SET/KITS/TRAYS/PACK) ×2 IMPLANT
YANKAUER SUCT BULB TIP 10FT TU (MISCELLANEOUS) ×4 IMPLANT

## 2014-09-02 NOTE — Anesthesia Postprocedure Evaluation (Signed)
Anesthesia Post Note  Patient: Jose Robles  Procedure(s) Performed: Procedure(s) (LRB): BILATERAL ANTERIOR TOTAL HIP ARTHROPLASTY (Bilateral)  Anesthesia type: Epidural/SAB  Patient location: PACU  Post pain: Pain level controlled  Post assessment: Post-op Vital signs reviewed  Last Vitals:  Filed Vitals:   09/02/14 0808  BP: 175/101  Pulse: 91  Temp: 36.3 C  Resp: 16    Post vital signs: Reviewed  Level of consciousness: awake  Complications: No apparent anesthesia complications

## 2014-09-02 NOTE — H&P (Signed)
TOTAL HIP ADMISSION H&P  Patient is admitted for bilaterally total hip arthroplasty.  Subjective:  Chief Complaint: bilaterally hip pain  HPI: Jose Robles, 68 y.o. male, has a history of pain and functional disability in the bilaterally hip(s) due to primary osetoarthritis and patient has failed non-surgical conservative treatments for greater than 12 weeks to include NSAID's and/or analgesics, flexibility and strengthening excercises, use of assistive devices and activity modification.  Onset of symptoms was abrupt starting 1 years ago with rapidlly worsening course since that time.The patient noted no past surgery on the bilaterally hip(s).  Patient currently rates pain in the bilaterally hip at 10 out of 10 with activity. Patient has night pain, worsening of pain with activity and weight bearing, trendelenberg gait, pain that interfers with activities of daily living, pain with passive range of motion and crepitus. Patient has evidence of subchondral cysts, subchondral sclerosis, periarticular osteophytes and joint space narrowing by imaging studies. This condition presents safety issues increasing the risk of falls.  There is no current active infection.  Patient Active Problem List   Diagnosis Date Noted  . Bilateral hip joint primary osteoarthritis 09/02/2014   Past Medical History  Diagnosis Date  . Arthritis   . Gout   . History of skin cancer   . Cancer     hx skin cancer    Past Surgical History  Procedure Laterality Date  . Knee arthroscopy  1993  . Ankle arthrotomy  1993    No prescriptions prior to admission   Allergies  Allergen Reactions  . Adhesive [Tape] Rash  . Doxycycline Rash  . Penicillins Rash  . Tetracyclines & Related Rash    History  Substance Use Topics  . Smoking status: Former Smoker    Quit date: 08/29/2009  . Smokeless tobacco: Not on file  . Alcohol Use: Yes     Comment: 6 times /wk    No family history on file.   Review of Systems   Musculoskeletal: Positive for joint pain.  All other systems reviewed and are negative.   Objective:  Physical Exam  Constitutional: He is oriented to person, place, and time. He appears well-developed and well-nourished.  HENT:  Head: Normocephalic and atraumatic.  Eyes: EOM are normal. Pupils are equal, round, and reactive to light.  Neck: Normal range of motion. Neck supple.  Cardiovascular: Normal rate and regular rhythm.   Respiratory: Effort normal and breath sounds normal.  GI: Soft. Bowel sounds are normal.  Musculoskeletal:       Right hip: He exhibits decreased range of motion, decreased strength and bony tenderness.       Left hip: He exhibits decreased range of motion, decreased strength and bony tenderness.  Neurological: He is alert and oriented to person, place, and time.  Skin: Skin is warm and dry.  Psychiatric: He has a normal mood and affect.    Vital signs in last 24 hours:    Labs:   There is no height or weight on file to calculate BMI.   Imaging Review Plain radiographs demonstrate severe degenerative joint disease of the bilateral hip(s). The bone quality appears to be good for age and reported activity level.  Assessment/Plan:  End stage arthritis, bilaterally hip(s)  The patient history, physical examination, clinical judgement of the provider and imaging studies are consistent with end stage degenerative joint disease of the bilaterally hip(s) and total hip arthroplasty is deemed medically necessary. The treatment options including medical management, injection therapy, arthroscopy and arthroplasty were  discussed at length. The risks and benefits of total hip arthroplasty were presented and reviewed. The risks due to aseptic loosening, infection, stiffness, dislocation/subluxation,  thromboembolic complications and other imponderables were discussed.  The patient acknowledged the explanation, agreed to proceed with the plan and consent was signed.  Patient is being admitted for inpatient treatment for surgery, pain control, PT, OT, prophylactic antibiotics, VTE prophylaxis, progressive ambulation and ADL's and discharge planning.The patient is planning to be discharged home with home health services

## 2014-09-02 NOTE — Anesthesia Preprocedure Evaluation (Signed)
Anesthesia Evaluation  Patient identified by MRN, date of birth, ID band Patient awake    Reviewed: Allergy & Precautions, H&P , Patient's Chart, lab work & pertinent test results  Airway Mallampati: II  TM Distance: >3 FB Neck ROM: full    Dental   Pulmonary former smoker,  breath sounds clear to auscultation        Cardiovascular Rhythm:regular Rate:Normal     Neuro/Psych    GI/Hepatic   Endo/Other    Renal/GU      Musculoskeletal  (+) Arthritis -,   Abdominal   Peds  Hematology   Anesthesia Other Findings   Reproductive/Obstetrics                             Anesthesia Physical Anesthesia Plan  ASA: II  Anesthesia Plan: Epidural and General   Post-op Pain Management:    Induction:   Airway Management Planned:   Additional Equipment:   Intra-op Plan:   Post-operative Plan:   Informed Consent: I have reviewed the patients History and Physical, chart, labs and discussed the procedure including the risks, benefits and alternatives for the proposed anesthesia with the patient or authorized representative who has indicated his/her understanding and acceptance.     Plan Discussed with:   Anesthesia Plan Comments:         Anesthesia Quick Evaluation

## 2014-09-02 NOTE — Op Note (Signed)
NAME:  Jose Robles, Jose Robles NO.:  [id number]  MEDICAL RECORD NO.:  [id number]  LOCATION:  [facility]                         FACILITY:  [facility]  PHYSICIAN:  Lind Guest. Ninfa Linden, M.D.DATE OF BIRTH:  Oct 18, 1945  DATE OF PROCEDURE:  09/02/2014 DATE OF DISCHARGE:                              OPERATIVE REPORT   PREOPERATIVE DIAGNOSIS:  Severe primary osteoarthritis and degenerative joint disease, bilateral hips.  POSTOPERATIVE DIAGNOSIS:  Severe primary osteoarthritis and degenerative joint disease, bilateral hips.  PROCEDURE:  Bilateral total hip arthroplasty through direct anterior approach.  IMPLANTS: 1. Left hip; size 54 acetabular component with a size 36+ 4 neutral     polyethylene liner, size 13 Corail femoral component with standard     offset, size 36+ 8.5 ceramic hip ball. 2. Right hip; size 54 acetabular component, size 36+ 4 neutral     polyethylene liner, size 13 Corail femoral component, size 36+ 1.5     ceramic hip ball.  SURGEON:  Jean Rosenthal, M.D.  ANESTHESIA:  Spinal with continuous epidural.  ANTIBIOTICS:  2 g IV Ancef.  BLOOD LOSS:  976 mL total.  COMPLICATIONS:  None.  INDICATIONS:  Jose Robles is a 68 year old healthy individual who is very thin.  He has bilateral hip end-stage osteoarthritis with severe degenerative joint disease.  There is periarticular osteophytes, subchondral sclerotic changes, cystic changes, and complete loss of joint space bilaterally.  Both hips hurt severely to the point that he wished to proceed with bilateral total hip arthroplasties.  He is a very healthy individual.  He is very thin.  I talked about proceeding with the left, more painful hip, first; and if the surgery was going well, to proceed to the right hip.  He understands the higher morbidity and mortality rate associated with bilateral joint replacements.  He understands the risks of acute blood loss anemia, nerve and vessel injury, fracture,  infection, dislocation, DVT, and death.  He does wish to proceed with surgery given his hopes of decreased pain, improved mobility, and overall improved quality of life.  PROCEDURE DESCRIPTION:  After informed consent was obtained, appropriate left and right hips were marked.  He was brought to the operating room and spinal anesthesia was obtained.  He was then laid in the supine position.  A Foley catheter was placed and traction boots were placed on both his feet.  Next, he was placed supine on the hana fracture table with the perineal post in place and both legs in inline skeletal traction devices, but no traction applied.  His left operative hip which was the first hip was prepped and draped with DuraPrep and sterile drapes.  A time-out was called to identify correct patient, correct left hip.  We then made an incision inferior and posterior to the anterior- superior iliac spine and carried this obliquely down the leg.  I dissected down to the tensor fascia lata muscle, and the tensor fascia lata was divided longitudinally so we could proceed with a direct anterior approach to the hip.  We cauterized the lateral femoral circumflex vessels and then placed Cobra retractors around the lateral neck and up underneath the rectus femoris around the medial femoral neck.  We  opened up the hip capsule in a L-type format finding a large joint effusion and significant osteophytes.  We placed Cobra retractors within the joint capsule.  I then made my femoral neck cut with an oscillating saw just proximal to the lesser trochanter and completed this with an osteotome.  I placed a corkscrew guide in the femoral head and removed the femoral head in its entirety and found it to be completely devoid of cartilage and hard as a rock.  We passed this off to the back table and then placed a bent Hohmann along the medial acetabular and a Cobra retractor laterally.  We then cleaned the acetabulum debris  including remnants of acetabular labrum.  I then began reaming under direct visualization from a size 42 reamer and 2 mm increments all the way up to a size 54, with all reamers under direct visualization and the last reamer under direct fluoroscopy, so we could obtain our depth of reaming, our inclination and anteversion.  Once I was pleased with this, we placed the real DePuy Sector Gription acetabular component and the real 36+ 4 neutral polyethylene liner for this acetabular component.  Attention was then turned to the femur. With the leg externally rotated to 100 degrees extended and adducted, we replaced a Mueller retractor medially and a Hohmann retractor behind the greater trochanter.  I released the lateral joint capsule.  I then used a box cutting osteotome to enter the femoral canal and a rongeur to lateralize.  I then began broaching from a size 8 broach using the Corail broaching system up to a size 13.  With a size 13, we felt this was stable.  So, we trialed a standard neck and a 36+ 1.5 hip ball.  We reduced this in the acetabulum and I felt like we needed to increase his length to one of two bowl sizes.  This was also based on his previous anatomy.  We then dislocated the hip and removed the trial components. After I was pleased with the stability and leg lengths, we removed all trial components and placed the real Corail femoral component size 13 and the real ceramic 36+ 8.5 hip ball, reduced this back in the acetabulum.  I was very pleased with the stability and range of motion. We copiously irrigated the soft tissues with normal saline solution using pulsatile lavage.  We closed the joint capsule with interrupted #1 Ethibond suture followed by running #1 Vicryl in the tensor fascia, 0 Vicryl in the deep tissue, 2-0 Vicryl in subcutaneous tissue, and staples on the skin.  Xeroform and an Aquacel dressing was applied.  We then talked with the Anesthesia and I felt we had  only lost 250 mL blood loss, and he was very stable that we could proceed with the right hip. We then went around to the right side, changed our gown and gloves and prepped the right side with DuraPrep and sterile drapes.  Time-out was called.  Again, he was identified as the correct patient, correct right hip at this point.  We then made incision inferior and posterior to the anterior superior iliac spine and carried this obliquely down the leg. We did all our dissection and exposure the exact same as the other side. We were able to make our femoral neck cut proximal to the lesser trochanter after getting down to the hip joint and removed the hip ball with its entirety and found it to be devoid of cartilage as well.  We then cleaned  the acetabulum debris, and just like the left side, went ahead with the same reaming process to place a 54 acetabular component and a 36+ 4 neutral polyethylene liner.  We then went to the femur, and on this side of the femur, we were able to expose it and then broached up to a size 13 broach.  We trialed a standard neck and then a 36+ 1.5 hip ball on this side based on again his anatomy and his preop leg lengths.  We were able to reduce this in the acetabulum, and I was pleased with stability, leg lengths, and offset.  I then dislocated the hip and removed the trial components.  We placed on the right side same as the left side, Corail femoral component size 13 and then the real 36+ 1.5 ceramic hip ball and reduced this back in the acetabulum and it was stable.  We are pleased with leg lengths and offset.  We then copiously irrigated the soft tissue on the right side with normal saline solution using pulsatile lavage and then closed from the capsule all the way up in the same fashion as the left side.  Staples were placed on the skin as well as Aquacel dressing.  He was then taken off the hana table and taken to the recovery room in stable condition.  All  final counts were correct, and there were no complications noted.  The final blood loss was 500 mL.     Lind Guest. Ninfa Linden, M.D.     CYB/MEDQ  D:  09/02/2014  T:  09/02/2014  Job:  [id number]

## 2014-09-02 NOTE — Brief Op Note (Signed)
09/02/2014  12:17 PM  PATIENT:  Jose Robles  67 y.o. male  PRE-OPERATIVE DIAGNOSIS:  BILATERAL SEVERE OSTEOARTHRITIS HIPS  POST-OPERATIVE DIAGNOSIS:  BILATERAL SEVERE OSTEOARTHRITIS HIPS  PROCEDURE:  Procedure(s): BILATERAL ANTERIOR TOTAL HIP ARTHROPLASTY (Bilateral)  SURGEON:  Surgeon(s) and Role:    * Mcarthur Rossetti, MD - Primary  ANESTHESIA:   epidural and spinal  EBL:  Total I/O In: 1860 [I.V.:1750; IV Piggyback:110] Out: 700 [Urine:200; Blood:500]  BLOOD ADMINISTERED:none  DRAINS: none   LOCAL MEDICATIONS USED:  NONE  SPECIMEN:  No Specimen  DISPOSITION OF SPECIMEN:  N/A  COUNTS:  YES  TOURNIQUET:  * No tourniquets in log *  DICTATION: .Other Dictation: Dictation Number [id number]  PLAN OF CARE: Admit to inpatient   PATIENT DISPOSITION:  PACU - hemodynamically stable.   Delay start of Pharmacological VTE agent (>24hrs) due to surgical blood loss or risk of bleeding: no

## 2014-09-02 NOTE — Transfer of Care (Signed)
Immediate Anesthesia Transfer of Care Note  Patient: Jose Robles  Procedure(s) Performed: Procedure(s): BILATERAL ANTERIOR TOTAL HIP ARTHROPLASTY (Bilateral)  Patient Location: PACU  Anesthesia Type:Regional, Spinal and Epidural  Level of Consciousness: awake, sedated and patient cooperative  Airway & Oxygen Therapy: Patient Spontanous Breathing and Patient connected to face mask oxygen  Post-op Assessment: Report given to PACU RN and Post -op Vital signs reviewed and stable  Post vital signs: Reviewed and stable  Complications: No apparent anesthesia complications

## 2014-09-02 NOTE — Care Management Note (Addendum)
    Page 1 of 2   09/04/2014     12:51:49 PM CARE MANAGEMENT NOTE 09/04/2014  Patient:  Jose Robles,Jose Robles   Account Number:  [id number]  Date Initiated:  09/02/2014  Documentation initiated by:  DAVIS,RHONDA  Subjective/Objective Assessment:   bilater hip replacements ant approach     Action/Plan:   home vs cir   Anticipated DC Date:  09/05/2014   Anticipated DC Plan:  [facility] referral  NA      DC Planning Services  CM consult      PAC Choice  NA   Choice offered to / List presented to:  NA      DME agency  NA     [place] arranged  HH-2 PT      [place].   Status of service:  Completed, signed off Medicare Important Message given?   (If response is "NO", the following Medicare IM given date fields will be blank) Date Medicare IM given:   Medicare IM given by:   Date Additional Medicare IM given:   Additional Medicare IM given by:    Discharge Disposition:  [place]  Per UR Regulation:  Reviewed for med. necessity/level of care/duration of stay  If discussed at [place] of Stay Meetings, dates discussed:    Comments:  09/03/14 12:49 Cm spoke with pt to offer choice of home health agency.  Pt chooses [facility] to render HHPT.  NO DME has been recc.  Pt requested shower stool but after learning of the price, has declined.  Address and contact information verified by pt.  referral called to [facility] rep, Kristen.  No other CM needs were communicated.  Mariane Masters, BSN, [place].  [id number] Rosana Hoes, RN, BSN, CCM Chart reviewed. Discharge needs and patient's stay to be reviewed and followed by case manager.

## 2014-09-02 NOTE — Anesthesia Procedure Notes (Signed)
Epidural Patient location during procedure: OR Start time: 09/02/2014 9:49 AM  Staffing Anesthesiologist: Rudean Curt Performed by: anesthesiologist   Preanesthetic Checklist Completed: patient identified, site marked, surgical consent, pre-op evaluation, timeout performed, IV checked, risks and benefits discussed, monitors and equipment checked and post-op pain management  Epidural Patient position: sitting Prep: ChloraPrep and site prepped and draped Patient monitoring: continuous pulse ox and blood pressure Approach: midline Location: L3-L4 Injection technique: LOR air and LOR saline  Needle:  Needle type: Tuohy  Needle gauge: 18 G Needle length: 9 cm and 9 Needle insertion depth: 6 cm Catheter type: closed end Catheter size: 19 Gauge Catheter at skin depth: 10 cm  Assessment Events: blood not aspirated, injection not painful, no injection resistance, negative IV test and no paresthesia  Additional Notes Patient identified.  Risk benefits discussed including failed block, incomplete pain control, headache, nerve damage, paralysis, blood pressure changes, nausea, vomiting, reactions to medication both toxic or allergic, and postpartum back pain.  Patient expressed understanding and wished to proceed.  All questions were answered.  Sterile technique used throughout procedure.  24 G sprotte used through touhey needle. CSF was clear.  No parasthesia or other complications.  Please see nursing notes for vital signs. Catheter threaded through touhey without difficulty.  Will test dose epidural catheter prior to using it.Reason for block:post-op pain management

## 2014-09-03 LAB — BASIC METABOLIC PANEL
Anion gap: 10 (ref 5–15)
BUN: 11 mg/dL (ref 6–23)
CHLORIDE: 105 meq/L (ref 96–112)
CO2: 24 mEq/L (ref 19–32)
Calcium: 8.4 mg/dL (ref 8.4–10.5)
Creatinine, Ser: 0.74 mg/dL (ref 0.50–1.35)
Glucose, Bld: 138 mg/dL — ABNORMAL HIGH (ref 70–99)
POTASSIUM: 4.1 meq/L (ref 3.7–5.3)
SODIUM: 139 meq/L (ref 137–147)

## 2014-09-03 LAB — CBC
HCT: 32.7 % — ABNORMAL LOW (ref 39.0–52.0)
Hemoglobin: 11.5 g/dL — ABNORMAL LOW (ref 13.0–17.0)
MCH: 34 pg (ref 26.0–34.0)
MCHC: 35.2 g/dL (ref 30.0–36.0)
MCV: 96.7 fL (ref 78.0–100.0)
Platelets: 146 10*3/uL — ABNORMAL LOW (ref 150–400)
RBC: 3.38 MIL/uL — ABNORMAL LOW (ref 4.22–5.81)
RDW: 12.1 % (ref 11.5–15.5)
WBC: 15 10*3/uL — AB (ref 4.0–10.5)

## 2014-09-03 MED ORDER — METHOCARBAMOL 500 MG PO TABS: 500.0000 mg | ORAL_TABLET | Freq: Four times a day (QID) | ORAL | Status: DC | PRN

## 2014-09-03 MED ORDER — ASPIRIN 325 MG PO TBEC: 325.0000 mg | DELAYED_RELEASE_TABLET | Freq: Two times a day (BID) | ORAL | Status: DC

## 2014-09-03 MED ORDER — OXYCODONE-ACETAMINOPHEN 5-325 MG PO TABS: 1.0000 | ORAL_TABLET | ORAL | Status: AC | PRN

## 2014-09-03 NOTE — Progress Notes (Addendum)
Transferred to 1540, in stable condition. Lanice Folden, [employer]

## 2014-09-03 NOTE — Evaluation (Signed)
Occupational Therapy Evaluation Patient Details Name: Jose Robles MRN: [id number] DOB: October 01, 1946 Today's Date: 09/03/2014    History of Present Illness Bil THR   Clinical Impression   Pt is s/p BTHA resulting in the deficits listed below (see OT Problem List).  Pt will benefit from skilled OT to increase their safety and independence with ADL and functional mobility for ADL to facilitate discharge to venue listed below.        Follow Up Recommendations  Home health OT    Equipment Recommendations  3 in 1 bedside comode       Precautions / Restrictions Precautions Precautions: Fall;Anterior Hip Precaution Comments: Epidural not completely worn off at time of eval.  L LE more affected than right Restrictions Weight Bearing Restrictions: No Other Position/Activity Restrictions: WBAT      Mobility Bed Mobility Overal bed mobility: Needs Assistance Bed Mobility: Sit to Supine       Sit to supine: Mod assist   General bed mobility comments: pt in chair  Transfers Overall transfer level: Needs assistance Equipment used: Rolling walker (2 wheeled) Transfers: Sit to/from Stand Sit to Stand: Min assist         General transfer comment: verbal cues for hand placement. performed sit to stand 4 times. pt with increased I each time         ADL       Grooming: Sitting;Set up   Upper Body Bathing: Sitting;Set up   Lower Body Bathing: Moderate assistance;Sit to/from stand   Upper Body Dressing : Set up;Sitting   Lower Body Dressing: Sit to/from stand;Moderate assistance   Toilet Transfer: Minimal Insurance claims handler Details (indicate cue type and reason): sit to stand Toileting- Clothing Manipulation and Hygiene: Sit to/from stand;Minimal assistance         General ADL Comments: verbal cues for hand placement and safety               Pertinent Vitals/Pain Pain Assessment: 0-10 Pain Score: 6  Pain Location: bilsteral hips Pain  Descriptors / Indicators: Burning;Tightness Pain Intervention(s): Limited activity within patient's tolerance;Monitored during session;Repositioned;Ice applied     Hand Dominance Right      Communication Communication Communication: No difficulties   Cognition Arousal/Alertness: Awake/alert Behavior During Therapy: WFL for tasks assessed/performed Overall Cognitive Status: Within Functional Limits for tasks assessed                                Home Living Family/patient expects to be discharged to:: Private residence Living Arrangements: Spouse/significant other Available Help at Discharge: Family Type of Home: House Home Access: Stairs to enter Technical brewer of Steps: 3 Entrance Stairs-Rails: Right Home Layout: One level     Bathroom Shower/Tub: Occupational psychologist: Standard     Home Equipment: Environmental consultant - 2 wheels;Crutches   Additional Comments: Pt has family checking on RW and number of wheels      Prior Functioning/Environment Level of Independence: Independent             OT Diagnosis: Generalized weakness;Acute pain   OT Problem List: Pain;Decreased strength;Decreased knowledge of use of DME or AE   OT Treatment/Interventions: Self-care/ADL training;DME and/or AE instruction;Patient/family education    OT Goals(Current goals can be found in the care plan section) Acute Rehab OT Goals 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

## 2014-09-03 NOTE — Addendum Note (Signed)
Addendum  created 09/03/14 0813 by Myrtie Soman, MD   Modules edited: Clinical Notes   Clinical Notes:  File: [id number]

## 2014-09-03 NOTE — Progress Notes (Signed)
Patient afebrile, vital signs stable.  Adequate analgesia. Site clean, dry, and intact. Patients questions answered.  Epidural removed, tip intact. Site clean and dry

## 2014-09-03 NOTE — Discharge Instructions (Signed)
Increase activities as comfort allows. Only up with your walker. Ice as needed for swelling. You can get your current dressings wet daily in the shower. Leave your dressings on until your outpatient follow-up. Do get an over the counter stool softener to take as needed daily to twice daily.

## 2014-09-03 NOTE — Evaluation (Signed)
Physical Therapy Evaluation Patient Details Name: Jose Robles MRN: [id number] DOB: [birth year] Today's Date: 09/03/2014   History of Present Illness  Bil THR  Clinical Impression  Pt s/p Bil THR presents with decreased Bil LE strength/ROM limiting functional activity.  Min pain at time of eval with epidural not completely worn off.  Pt should progress to d/c home with family assist and HHPT follow up.    Follow Up Recommendations Home health PT    Equipment Recommendations  None recommended by PT    Recommendations for Other Services OT consult     Precautions / Restrictions Precautions Precautions: Fall Precaution Comments: Epidural not completely worn off at time of eval.  L LE more affected than right Restrictions Weight Bearing Restrictions: No Other Position/Activity Restrictions: WBAT      Mobility  Bed Mobility Overal bed mobility: Needs Assistance Bed Mobility: Supine to Sit     Supine to sit: Mod assist;+2 for physical assistance;+2 for safety/equipment     General bed mobility comments: cues for sequence with assist to manage BIl LE and to bring trunk to upright  Transfers Overall transfer level: Needs assistance Equipment used: Rolling walker (2 wheeled) Transfers: Sit to/from Stand Sit to Stand: Mod assist;+2 physical assistance;+2 safety/equipment         General transfer comment: cues for transition position and use of UEs to self assist  Ambulation/Gait Ambulation/Gait assistance: Min assist;Mod assist;+2 physical assistance;+2 safety/equipment Ambulation Distance (Feet): 50 Feet Assistive device: Rolling walker (2 wheeled) Gait Pattern/deviations: Step-to pattern;Decreased step length - right;Decreased step length - left;Shuffle;Trunk flexed     General Gait Details: cues for sequence, posture and position from RW.  Assist required for balance/support and RW management  Stairs            Wheelchair Mobility    Modified Rankin  (Stroke Patients Only)       Balance                                             Pertinent Vitals/Pain Pain Assessment: 0-10 Pain Score: 1  Pain Location: stinging at incision sites Pain Intervention(s): Limited activity within patient's tolerance;Monitored during session;Ice applied (epidural not completely worn off - L LE more affected)    Home Living Family/patient expects to be discharged to:: Private residence Living Arrangements: Spouse/significant other Available Help at Discharge: Family Type of Home: House Home Access: Stairs to enter Entrance Stairs-Rails: Right Entrance Stairs-Number of Steps: 3 Home Layout: One level Home Equipment: Environmental consultant - 2 wheels;Crutches Additional Comments: Pt has family checking on RW and number of wheels    Prior Function Level of Independence: Independent               Hand Dominance   Dominant Hand: Right    Extremity/Trunk Assessment   Upper Extremity Assessment: Overall WFL for tasks assessed           Lower Extremity Assessment: RLE deficits/detail;LLE deficits/detail RLE Deficits / Details: 2+/5 hip strength with AAROM at hip to 90 flex and 20 abd LLE Deficits / Details: 2-/5 hip strength with AAROM at hip to 90 flex and 20 abd  Cervical / Trunk Assessment: Normal  Communication   Communication: No difficulties  Cognition Arousal/Alertness: Awake/alert Behavior During Therapy: WFL for tasks assessed/performed Overall Cognitive Status: Within Functional Limits for tasks assessed  General Comments      Exercises Total Joint Exercises Ankle Circles/Pumps: AROM;Both;15 reps;Supine Quad Sets: AROM;Both;10 reps;Supine Heel Slides: AAROM;15 reps;Supine;Both Hip ABduction/ADduction: AAROM;Both;15 reps;Supine      Assessment/Plan    PT Assessment Patient needs continued PT services  PT Diagnosis Difficulty walking   PT Problem List Decreased strength;Decreased  range of motion;Decreased activity tolerance;Decreased mobility;Decreased knowledge of use of DME;Pain;Decreased balance;Decreased safety awareness  PT Treatment Interventions DME instruction;Gait training;Stair training;Functional mobility training;Therapeutic activities;Therapeutic exercise;Patient/family education   PT Goals (Current goals can be found in the Care Plan section) Acute Rehab PT Goals 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[employer] PT Goal Formulation: With patient Time For Goal Achievement: 09/10/14 Potential to Achieve Goals: Good    Frequency 7X/week   Barriers to discharge        Co-evaluation               End of Session Equipment Utilized During Treatment: Gait belt Activity Tolerance: Patient tolerated treatment well Patient left: in chair;with call bell/phone within reach;with family/visitor present Nurse Communication: Mobility status         Time: 5009-3818 PT Time Calculation (min) (ACUTE ONLY): 37 min   Charges:   PT Evaluation $Initial PT Evaluation Tier I: 1 Procedure PT Treatments $Gait Training: 8-22 mins $Therapeutic Exercise: 8-22 mins   PT G Codes:          Jamica Woodyard 09/03/2014, 11:08 AM

## 2014-09-03 NOTE — Progress Notes (Signed)
Subjective: 1 Day Post-Op Procedure(s) (LRB): BILATERAL ANTERIOR TOTAL HIP ARTHROPLASTY (Bilateral) Patient reports pain as mild.  Epidural is out now.  Comfortable.  Awaiting therapy to attempt mobilization.  Hopes to go home later today is possible; depends on pain control and progress with therapy.  Objective: Vital signs in last 24 hours: Temp:  [97.7 F (36.5 C)-98.8 F (37.1 C)] 98.5 F (36.9 C) (11/28 0556) Pulse Rate:  [66-94] 66 (11/28 0556) Resp:  [11-18] 16 (11/28 0556) BP: (89-142)/(47-96) 109/68 mmHg (11/28 0556) SpO2:  [94 %-100 %] 95 % (11/28 0556)  Intake/Output from previous day: 11/27 0701 - 11/28 0700 In: 4402.5 [P.O.:1080; I.V.:3162.5; IV Piggyback:160] Out: 4100 [Urine:3600; Blood:500] Intake/Output this shift: Total I/O In: 240 [P.O.:240] Out: -    Recent Labs  09/03/14 0505  HGB 11.5*    Recent Labs  09/03/14 0505  WBC 15.0*  RBC 3.38*  HCT 32.7*  PLT 146*    Recent Labs  09/03/14 0505  NA 139  K 4.1  CL 105  CO2 24  BUN 11  CREATININE 0.74  GLUCOSE 138*  CALCIUM 8.4   No results for input(s): LABPT, INR in the last 72 hours.  Sensation intact distally Intact pulses distally Dorsiflexion/Plantar flexion intact Incision: dressing C/D/I  Assessment/Plan: 1 Day Post-Op Procedure(s) (LRB): BILATERAL ANTERIOR TOTAL HIP ARTHROPLASTY (Bilateral) Up with therapy Discharge home with home health when clears PT.  Jose Robles Y 09/03/2014, 10:03 AM

## 2014-09-03 NOTE — Progress Notes (Signed)
Physical Therapy Treatment Patient Details Name: Jose Robles MRN: [id number] DOB: 02-13-1946 Today's Date: 09/03/2014    History of Present Illness Bil THR    PT Comments    Pt continues motivated and somewhat impulsive but progressing with mobility despite increased pain level since epidural dc.  Pt hoping for dc tomorrow.  Follow Up Recommendations  Home health PT     Equipment Recommendations  None recommended by PT    Recommendations for Other Services OT consult     Precautions / Restrictions Precautions Precautions: Fall;Anterior Hip Precaution Comments: Epidural not completely worn off at time of eval.  L LE more affected than right Restrictions Weight Bearing Restrictions: No Other Position/Activity Restrictions: WBAT    Mobility  Bed Mobility Overal bed mobility: Needs Assistance Bed Mobility: Sit to Supine       Sit to supine: Mod assist   General bed mobility comments: pt in chair  Transfers Overall transfer level: Needs assistance Equipment used: Rolling walker (2 wheeled) Transfers: Sit to/from Stand Sit to Stand: Min assist         General transfer comment: verbal cues for hand placement. performed sit to stand 4 times. pt with increased I each time  Ambulation/Gait Ambulation/Gait assistance: Min assist Ambulation Distance (Feet): 100 Feet Assistive device: Rolling walker (2 wheeled) Gait Pattern/deviations: Step-to pattern;Step-through pattern;Decreased step length - right;Decreased step length - left;Shuffle;Trunk flexed Gait velocity: decr   General Gait Details: cues for sequence, posture and position from RW.     Stairs            Wheelchair Mobility    Modified Rankin (Stroke Patients Only)       Balance                                    Cognition Arousal/Alertness: Awake/alert Behavior During Therapy: WFL for tasks assessed/performed Overall Cognitive Status: Within Functional Limits for tasks  assessed                      Exercises      General Comments        Pertinent Vitals/Pain Pain Assessment: 0-10 Pain Score: 6  Pain Location: bilsteral hips Pain Descriptors / Indicators: Burning;Tightness Pain Intervention(s): Limited activity within patient's tolerance;Monitored during session;Repositioned;Ice applied    Home Living Family/patient expects to be discharged to:: Private residence Living Arrangements: Spouse/significant other Available Help at Discharge: Family Type of Home: House Home Access: Stairs to enter Entrance Stairs-Rails: Right Home Layout: One level Home Equipment: Environmental consultant - 2 wheels;Crutches Additional Comments: Pt has family checking on RW and number of wheels    Prior Function Level of Independence: Independent          PT Goals (current goals can now be found in the care plan section) Acute Rehab PT Goals 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

## 2014-09-04 LAB — CBC
HCT: 30.9 % — ABNORMAL LOW (ref 39.0–52.0)
Hemoglobin: 10.9 g/dL — ABNORMAL LOW (ref 13.0–17.0)
MCH: 34.3 pg — ABNORMAL HIGH (ref 26.0–34.0)
MCHC: 35.3 g/dL (ref 30.0–36.0)
MCV: 97.2 fL (ref 78.0–100.0)
Platelets: 136 10*3/uL — ABNORMAL LOW (ref 150–400)
RBC: 3.18 MIL/uL — AB (ref 4.22–5.81)
RDW: 12.4 % (ref 11.5–15.5)
WBC: 11.8 10*3/uL — AB (ref 4.0–10.5)

## 2014-09-04 NOTE — Plan of Care (Signed)
Problem: Discharge Progression Outcomes Goal: Barriers To Progression Addressed/Resolved Outcome: Not Applicable Date Met:  12/82/08 Goal: CMS/Neurovascular status at or above baseline Outcome: Completed/Met Date Met:  09/04/14 Goal: Anticoagulant follow-up in place Outcome: Not Applicable Date Met:  13/88/71 asa Goal: Pain controlled with appropriate interventions Outcome: Completed/Met Date Met:  09/04/14 Goal: Hemodynamically stable Outcome: Completed/Met Date Met:  95/97/47 Goal: Complications resolved/controlled Outcome: Not Applicable Date Met:  18/55/01 Goal: Tolerates diet Outcome: Completed/Met Date Met:  09/04/14 Goal: Activity appropriate for discharge plan Outcome: Completed/Met Date Met:  09/04/14 Goal: Ambulates safely using assistive device Outcome: Completed/Met Date Met:  09/04/14 Goal: Follows weight - bearing limitations Outcome: Completed/Met Date Met:  09/04/14 Goal: Discharge plan in place and appropriate Outcome: Completed/Met Date Met:  09/04/14 Goal: Negotiates stairs Outcome: Completed/Met Date Met:  09/04/14 Goal: Demonstrates ADLs as appropriate Outcome: Completed/Met Date Met:  09/04/14 Goal: Incision without S/S infection Outcome: Completed/Met Date Met:  09/04/14 Goal: Other Discharge Outcomes/Goals Outcome: Not Applicable Date Met:  58/68/25

## 2014-09-04 NOTE — Progress Notes (Signed)
Subjective: Pt stable - pain ok   Objective: Vital signs in last 24 hours: Temp:  [98.2 F (36.8 C)-99 F (37.2 C)] 99 F (37.2 C) (11/29 0600) Pulse Rate:  [77-89] 80 (11/29 0600) Resp:  [16-18] 16 (11/29 0800) BP: (125-143)/(67-78) 125/67 mmHg (11/29 0600) SpO2:  [96 %-100 %] 96 % (11/29 0800)  Intake/Output from previous day: 11/28 0701 - 11/29 0700 In: 6165.3 [P.O.:720; I.V.:5340.3; IV Piggyback:105] Out: 2900 [Urine:525] Intake/Output this shift:    Exam:  Neurovascular intact Sensation intact distally Intact pulses distally  Labs:  Recent Labs  09/03/14 0505 09/04/14 0510  HGB 11.5* 10.9*    Recent Labs  09/03/14 0505 09/04/14 0510  WBC 15.0* 11.8*  RBC 3.38* 3.18*  HCT 32.7* 30.9*  PLT 146* 136*    Recent Labs  09/03/14 0505  NA 139  K 4.1  CL 105  CO2 24  BUN 11  CREATININE 0.74  GLUCOSE 138*  CALCIUM 8.4   No results for input(s): LABPT, INR in the last 72 hours.  Assessment/Plan: Plan dc today if ok with PT   Jose Robles 09/04/2014, 10:32 AM

## 2014-09-04 NOTE — Progress Notes (Addendum)
Physical Therapy Treatment Patient Details Name: Jose Robles MRN: [id number] DOB: [birth year] Today's Date: 09/04/2014    History of Present Illness Bil THR    PT Comments    POD # 2.  Assisted OOB to amb a great distance in hallway which pt requested to "get out the stiffness" B hips.  Practiced stairs twice/2 different approaches.  Performed THR TE's then applied ICE.  Pt required increased time and had a lot of questions.    Follow Up Recommendations  Home health PT     Equipment Recommendations   (has a RW and declined BSC)    Recommendations for Other Services       Precautions / Restrictions Precautions Precautions: Fall;Anterior Hip Restrictions Weight Bearing Restrictions: No Other Position/Activity Restrictions: WBAT    Mobility  Bed Mobility Overal bed mobility: Needs Assistance Bed Mobility: Supine to Sit;Sit to Supine     Supine to sit: Min guard Sit to supine: Min assist;Mod assist   General bed mobility comments: increased assistance needed for sit to supine B LE up onto bed  Transfers Overall transfer level: Needs assistance Equipment used: Rolling walker (2 wheeled) Transfers: Sit to/from Stand Sit to Stand: Min guard;Supervision         General transfer comment: one VC on proper tech and safety to avoid pulling up on RW.  One VC safety with turn completion prior to sit.  Ambulation/Gait Ambulation/Gait assistance: Min guard Ambulation Distance (Feet): 350 Feet Assistive device: Rolling walker (2 wheeled) Gait Pattern/deviations: Step-to pattern;Step-through pattern;Decreased step length - right;Decreased step length - left;Trunk flexed Gait velocity: decreased   General Gait Details: cues for sequence, posture and position from RW.  Pt tolerated amb distance wanting to work out the stiffness.   Stairs Stairs: Yes Stairs assistance: Min guard Stair Management: One rail Left;Step to pattern;Forwards;With crutches Number of Stairs:  2 General stair comments: performed stairs twice.  first time up backward with RW then second time up forward with one crutch/one rail.  Pt much preferred forward and has crutches at home.    Wheelchair Mobility    Modified Rankin (Stroke Patients Only)       Balance                                    Cognition                            Exercises   Total Hip Replacement TE's 10 reps ankle pumps 10 reps knee presses 10 reps heel slides 10 reps SAQ's 10 reps ABD Followed by ICE     General Comments        Pertinent Vitals/Pain Pain Assessment: 0-10 Pain Score: 6  Pain Location: Bilateral hips Pain Descriptors / Indicators: Burning;Tightness;Tingling Pain Intervention(s): Monitored during session;Premedicated before session;Repositioned;Patient requesting pain meds-RN notified;Ice applied    Home Living                      Prior Function            PT Goals (current goals can now be found in the care plan section) Progress towards PT goals: Progressing toward goals    Frequency  7X/week    PT Plan Current plan remains appropriate    Co-evaluation             End of Session Equipment  Utilized During Treatment: Gait belt Activity Tolerance: Patient tolerated treatment well Patient left: in chair;with call bell/phone within reach     Time: 0823-0921 PT Time Calculation (min) (ACUTE ONLY): 58 min  Charges:  $Gait Training: 23-37 mins $Therapeutic Exercise: 8-22 mins $Therapeutic Activity: 8-22 mins                    G Codes:      Rica Koyanagi  PTA [facility]  Acute  Rehab Pager      [phone]

## 2014-09-04 NOTE — Progress Notes (Signed)
Discharged from floor via w/c, wife with pt. No changes in assessment. Deonna Krummel  

## 2014-09-05 ENCOUNTER — Encounter (HOSPITAL_COMMUNITY): Payer: Self-pay | Admitting: Orthopaedic Surgery

## 2014-09-15 DIAGNOSIS — Z96643 Presence of artificial hip joint, bilateral: Secondary | ICD-10-CM | POA: Diagnosis not present

## 2014-09-15 NOTE — Discharge Summary (Signed)
Patient ID: Jose Robles MRN: [id number] DOB/AGE: 1946-03-06 68 y.o.  Admit date: 09/02/2014 Discharge date: 09/15/2014  Admission Diagnoses:  Principal Problem:   Bilateral hip joint primary osteoarthritis Active Problems:   Status post bilateral total hip replacement   Discharge Diagnoses:  Same  Past Medical History  Diagnosis Date  . Arthritis   . Gout   . History of skin cancer   . Cancer     hx skin cancer  . Nocturnal leg cramps     early in am- different parts of legs at different times. Relieved by mustard and ambulation    Surgeries: Procedure(s): BILATERAL ANTERIOR TOTAL HIP ARTHROPLASTY on 09/02/2014   Consultants:    Discharged Condition: Improved  Hospital Course: Jose Robles is an 68 y.o. male who was admitted 09/02/2014 for operative treatment ofBilateral hip joint arthritis. Patient has severe unremitting pain that affects sleep, daily activities, and work/hobbies. After pre-op clearance the patient was taken to the operating room on 09/02/2014 and underwent  Procedure(s): BILATERAL ANTERIOR TOTAL HIP ARTHROPLASTY.    Patient was given perioperative antibiotics:  Anti-infectives    Start     Dose/Rate Route Frequency Ordered Stop   09/02/14 1600  ceFAZolin (ANCEF) IVPB 1 g/50 mL premix     1 g100 mL/hr over 30 Minutes Intravenous Every 6 hours 09/02/14 1532 09/02/14 2301   09/02/14 0808  ceFAZolin (ANCEF) IVPB 2 g/50 mL premix     2 g100 mL/hr over 30 Minutes Intravenous On call to O.R. 09/02/14 2778 09/02/14 0935       Patient was given sequential compression devices, early ambulation, and chemoprophylaxis to prevent DVT.  Patient benefited maximally from hospital stay and there were no complications.    Recent vital signs: No data found.    Recent laboratory studies: No results for input(s): WBC, HGB, HCT, PLT, NA, K, CL, CO2, BUN, CREATININE, GLUCOSE, INR, CALCIUM in the last 72 hours.  Invalid input(s): PT, 2   Discharge  Medications:     Medication List    STOP taking these medications        acetaminophen 500 MG tablet  Commonly known as:  TYLENOL     naproxen sodium 220 MG tablet  Commonly known as:  ANAPROX      TAKE these medications        aspirin 325 MG EC tablet  Take 1 tablet (325 mg total) by mouth 2 (two) times daily after a meal.     methocarbamol 500 MG tablet  Commonly known as:  ROBAXIN  Take 1 tablet (500 mg total) by mouth every 6 (six) hours as needed for muscle spasms.     oxyCODONE-acetaminophen 5-325 MG per tablet  Commonly known as:  ROXICET  Take 1-2 tablets by mouth every 4 (four) hours as needed.        Diagnostic Studies: Dg Hip 1 View Left  09/02/2014   CLINICAL DATA:  Hip replacement.  EXAM: LEFT HIP - 1 VIEW:  COMPARISON:  None.  FINDINGS: Total left hip replacement.  No fracture.  No dislocation .  IMPRESSION: Total left hip replacement .  Good anatomic alignment.   Electronically Signed   By: Marcello Moores  Register   On: 09/02/2014 12:04   Dg Hip Complete Right  09/02/2014   CLINICAL DATA:  Total right hip replacement.  EXAM: RIGHT HIP - COMPLETE 2+ VIEW  COMPARISON:  None.  FINDINGS: Total right hip replacement.  Good anatomic alignment.  IMPRESSION: Good anatomic alignment following total right  hip replacement.   Electronically Signed   By: Marcello Moores  Register   On: 09/02/2014 12:05   Dg Pelvis Portable  09/02/2014   CLINICAL DATA:  Portable AP view of the pelvis status post bilateral total hip joint replacement  EXAM: PORTABLE PELVIS 1-2 VIEWS  COMPARISON:  None.  FINDINGS: There bilateral hip joint prostheses present. Radiographic positioning of the prosthetic components is good. The interface with the native bone is normal. There are surgical skin staples present. The observed portions of the bony pelvis are normal.  IMPRESSION: The patient has undergone bilateral total hip joint prosthesis placement. There is no evidence of immediate postprocedure complication.    Electronically Signed   By: David  Martinique   On: 09/02/2014 13:12   Dg Hip Portable 1 View Right  09/02/2014   CLINICAL DATA:  Post bilateral total hip replacement.  EXAM: PORTABLE RIGHT HIP - 1 VIEW  COMPARISON:  Earlier same day.  FINDINGS: Single cross-table lateral portable view demonstrates right total hip arthroplasty intact and normally located. Skin staples present over the soft tissues.  IMPRESSION: Uncomplicated right total hip arthroplasty.   Electronically Signed   By: Marin Olp M.D.   On: 09/02/2014 13:13   Dg Hip Portable 1 View Left  09/02/2014   CLINICAL DATA:  Hip replacement.  EXAM: PORTABLE LEFT HIP - 1 VIEW  COMPARISON:  09/02/2014 .  FINDINGS: Left total hip replacement.  No acute abnormality.  IMPRESSION: Total left hip replacement.  No acute abnormality.   Electronically Signed   By: Marcello Moores  Register   On: 09/02/2014 13:11   Dg C-arm 61-120 Min-no Report  09/02/2014   CLINICAL DATA: surgery   C-ARM 61-120 MINUTES  Fluoroscopy was utilized by the requesting physician.  No radiographic  interpretation.     Disposition: 06-Home-Health Care Svc      Discharge Instructions    Call MD / Call 911    Complete by:  As directed   If you experience chest pain or shortness of breath, CALL 911 and be transported to the hospital emergency room.  If you develope a fever above 101 F, pus (white drainage) or increased drainage or redness at the wound, or calf pain, call your surgeon's office.     Constipation Prevention    Complete by:  As directed   Drink plenty of fluids.  Prune juice may be helpful.  You may use a stool softener, such as Colace (over the counter) 100 mg twice a day.  Use MiraLax (over the counter) for constipation as needed.     Diet - low sodium heart healthy    Complete by:  As directed      Increase activity slowly as tolerated    Complete by:  As directed            Follow-up Information    Follow up with Mcarthur Rossetti, MD.   Specialty:   Orthopedic Surgery   Contact information:   [place] [address] [phone]       Follow up with [facility].   Why:  home health physical therapy   Contact information:   [address] [place] [address] [phone]        Signed: Mcarthur Rossetti 09/15/2014, 12:41 PM

## 2014-09-20 DIAGNOSIS — M10071 Idiopathic gout, right ankle and foot: Secondary | ICD-10-CM | POA: Diagnosis not present

## 2014-09-28 DIAGNOSIS — M79604 Pain in right leg: Secondary | ICD-10-CM | POA: Diagnosis not present

## 2014-09-28 DIAGNOSIS — M10071 Idiopathic gout, right ankle and foot: Secondary | ICD-10-CM | POA: Diagnosis not present

## 2014-12-21 DIAGNOSIS — L821 Other seborrheic keratosis: Secondary | ICD-10-CM | POA: Diagnosis not present

## 2014-12-21 DIAGNOSIS — Z85828 Personal history of other malignant neoplasm of skin: Secondary | ICD-10-CM | POA: Diagnosis not present

## 2014-12-21 DIAGNOSIS — L209 Atopic dermatitis, unspecified: Secondary | ICD-10-CM | POA: Diagnosis not present

## 2015-03-14 DIAGNOSIS — M1A09X0 Idiopathic chronic gout, multiple sites, without tophus (tophi): Secondary | ICD-10-CM | POA: Diagnosis not present

## 2015-03-22 DIAGNOSIS — M160 Bilateral primary osteoarthritis of hip: Secondary | ICD-10-CM | POA: Diagnosis not present

## 2015-03-22 DIAGNOSIS — M10071 Idiopathic gout, right ankle and foot: Secondary | ICD-10-CM | POA: Diagnosis not present

## 2015-03-22 DIAGNOSIS — Z96643 Presence of artificial hip joint, bilateral: Secondary | ICD-10-CM | POA: Diagnosis not present

## 2015-03-31 DIAGNOSIS — L309 Dermatitis, unspecified: Secondary | ICD-10-CM | POA: Diagnosis not present

## 2015-03-31 DIAGNOSIS — L111 Transient acantholytic dermatosis [Grover]: Secondary | ICD-10-CM | POA: Diagnosis not present

## 2015-03-31 DIAGNOSIS — L570 Actinic keratosis: Secondary | ICD-10-CM | POA: Diagnosis not present

## 2015-03-31 DIAGNOSIS — L820 Inflamed seborrheic keratosis: Secondary | ICD-10-CM | POA: Diagnosis not present

## 2015-03-31 DIAGNOSIS — D485 Neoplasm of uncertain behavior of skin: Secondary | ICD-10-CM | POA: Diagnosis not present

## 2015-04-05 DIAGNOSIS — A6920 Lyme disease, unspecified: Secondary | ICD-10-CM | POA: Diagnosis not present

## 2015-04-18 DIAGNOSIS — M1A09X0 Idiopathic chronic gout, multiple sites, without tophus (tophi): Secondary | ICD-10-CM | POA: Diagnosis not present

## 2015-04-22 IMAGING — DX DG HIP 1V PORT*L*
1 series · 1 of 1 positions shown · non-contrast
Comparison: 09/02/2014 .

CLINICAL DATA: Hip replacement.

EXAM:
PORTABLE LEFT HIP - 1 VIEW

[hip ap]
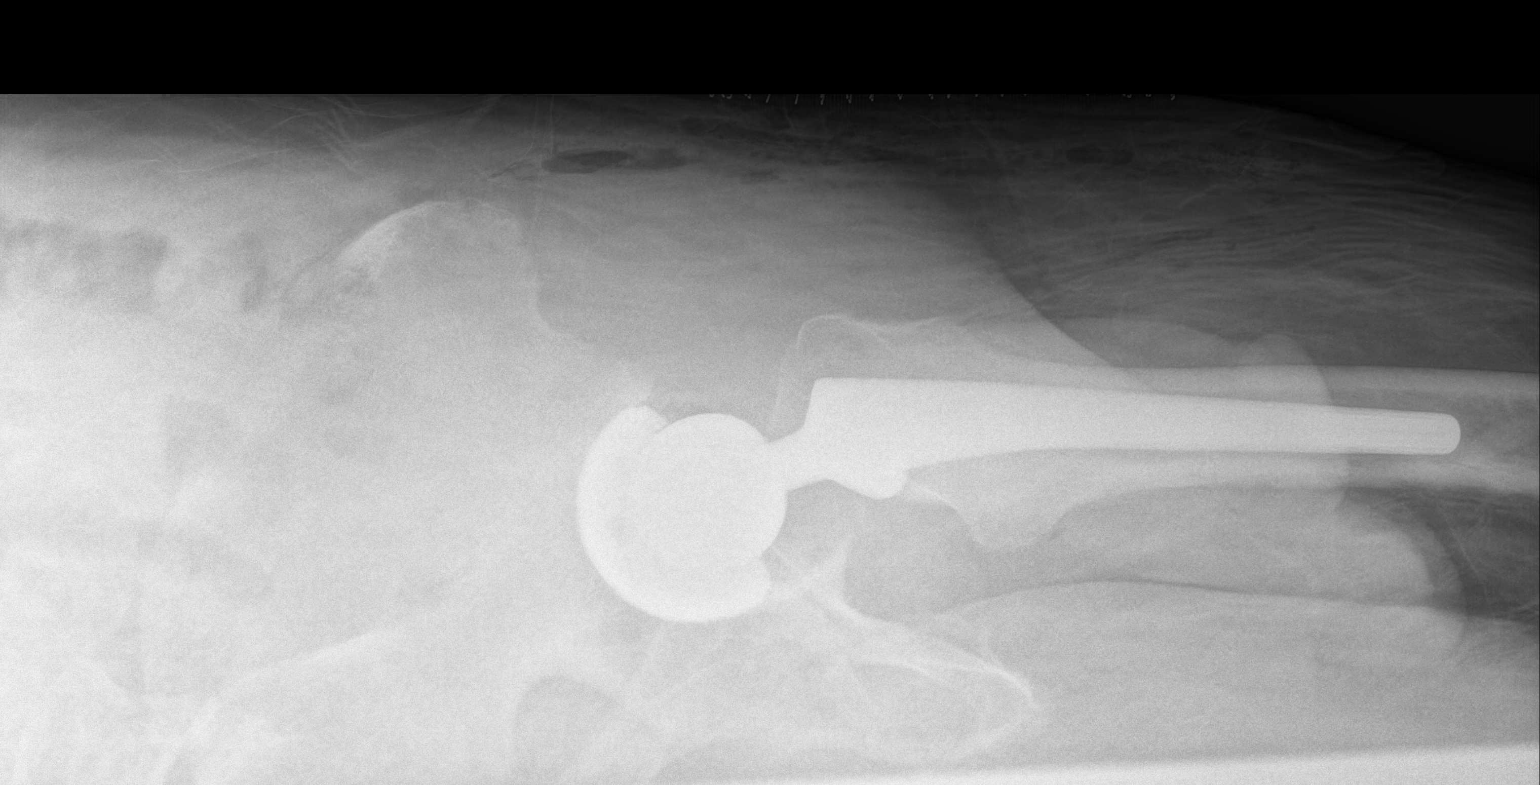

[1 of 1 positions shown; findings below may reference images not displayed]

FINDINGS: Left total hip replacement.  No acute abnormality.
IMPRESSION: Total left hip replacement.  No acute abnormality.

## 2015-04-25 DIAGNOSIS — M1A09X0 Idiopathic chronic gout, multiple sites, without tophus (tophi): Secondary | ICD-10-CM | POA: Diagnosis not present

## 2015-05-09 DIAGNOSIS — H6122 Impacted cerumen, left ear: Secondary | ICD-10-CM | POA: Diagnosis not present

## 2015-05-09 DIAGNOSIS — H903 Sensorineural hearing loss, bilateral: Secondary | ICD-10-CM | POA: Diagnosis not present

## 2015-05-09 DIAGNOSIS — H6521 Chronic serous otitis media, right ear: Secondary | ICD-10-CM | POA: Diagnosis not present

## 2015-05-09 DIAGNOSIS — J301 Allergic rhinitis due to pollen: Secondary | ICD-10-CM | POA: Diagnosis not present

## 2015-06-05 DIAGNOSIS — M1A09X0 Idiopathic chronic gout, multiple sites, without tophus (tophi): Secondary | ICD-10-CM | POA: Diagnosis not present

## 2015-06-22 DIAGNOSIS — L821 Other seborrheic keratosis: Secondary | ICD-10-CM | POA: Diagnosis not present

## 2015-06-22 DIAGNOSIS — Z85828 Personal history of other malignant neoplasm of skin: Secondary | ICD-10-CM | POA: Diagnosis not present

## 2015-06-22 DIAGNOSIS — L111 Transient acantholytic dermatosis [Grover]: Secondary | ICD-10-CM | POA: Diagnosis not present

## 2015-06-22 DIAGNOSIS — L570 Actinic keratosis: Secondary | ICD-10-CM | POA: Diagnosis not present

## 2015-06-22 DIAGNOSIS — L820 Inflamed seborrheic keratosis: Secondary | ICD-10-CM | POA: Diagnosis not present

## 2015-07-17 DIAGNOSIS — M1A09X0 Idiopathic chronic gout, multiple sites, without tophus (tophi): Secondary | ICD-10-CM | POA: Diagnosis not present

## 2015-08-22 DIAGNOSIS — Z23 Encounter for immunization: Secondary | ICD-10-CM | POA: Diagnosis not present

## 2015-08-29 DIAGNOSIS — M1A09X0 Idiopathic chronic gout, multiple sites, without tophus (tophi): Secondary | ICD-10-CM | POA: Diagnosis not present

## 2015-09-06 DIAGNOSIS — M160 Bilateral primary osteoarthritis of hip: Secondary | ICD-10-CM | POA: Diagnosis not present

## 2015-09-06 DIAGNOSIS — Z96643 Presence of artificial hip joint, bilateral: Secondary | ICD-10-CM | POA: Diagnosis not present

## 2015-09-20 DIAGNOSIS — M1A09X0 Idiopathic chronic gout, multiple sites, without tophus (tophi): Secondary | ICD-10-CM | POA: Diagnosis not present

## 2015-09-20 DIAGNOSIS — M150 Primary generalized (osteo)arthritis: Secondary | ICD-10-CM | POA: Diagnosis not present

## 2015-10-17 DIAGNOSIS — H10413 Chronic giant papillary conjunctivitis, bilateral: Secondary | ICD-10-CM | POA: Diagnosis not present

## 2015-10-17 DIAGNOSIS — H02831 Dermatochalasis of right upper eyelid: Secondary | ICD-10-CM | POA: Diagnosis not present

## 2015-10-17 DIAGNOSIS — H25813 Combined forms of age-related cataract, bilateral: Secondary | ICD-10-CM | POA: Diagnosis not present

## 2015-10-17 DIAGNOSIS — H04123 Dry eye syndrome of bilateral lacrimal glands: Secondary | ICD-10-CM | POA: Diagnosis not present

## 2015-10-17 DIAGNOSIS — H35371 Puckering of macula, right eye: Secondary | ICD-10-CM | POA: Diagnosis not present

## 2015-10-17 DIAGNOSIS — H02834 Dermatochalasis of left upper eyelid: Secondary | ICD-10-CM | POA: Diagnosis not present

## 2015-11-14 DIAGNOSIS — M1A09X0 Idiopathic chronic gout, multiple sites, without tophus (tophi): Secondary | ICD-10-CM | POA: Diagnosis not present

## 2015-12-19 DIAGNOSIS — M150 Primary generalized (osteo)arthritis: Secondary | ICD-10-CM | POA: Diagnosis not present

## 2015-12-19 DIAGNOSIS — M1A09X0 Idiopathic chronic gout, multiple sites, without tophus (tophi): Secondary | ICD-10-CM | POA: Diagnosis not present

## 2015-12-20 DIAGNOSIS — C44519 Basal cell carcinoma of skin of other part of trunk: Secondary | ICD-10-CM | POA: Diagnosis not present

## 2015-12-20 DIAGNOSIS — D485 Neoplasm of uncertain behavior of skin: Secondary | ICD-10-CM | POA: Diagnosis not present

## 2015-12-20 DIAGNOSIS — Z85828 Personal history of other malignant neoplasm of skin: Secondary | ICD-10-CM | POA: Diagnosis not present

## 2015-12-20 DIAGNOSIS — L820 Inflamed seborrheic keratosis: Secondary | ICD-10-CM | POA: Diagnosis not present

## 2015-12-20 DIAGNOSIS — L821 Other seborrheic keratosis: Secondary | ICD-10-CM | POA: Diagnosis not present

## 2015-12-20 DIAGNOSIS — L812 Freckles: Secondary | ICD-10-CM | POA: Diagnosis not present

## 2016-01-25 DIAGNOSIS — C44519 Basal cell carcinoma of skin of other part of trunk: Secondary | ICD-10-CM | POA: Diagnosis not present

## 2016-02-08 DIAGNOSIS — M150 Primary generalized (osteo)arthritis: Secondary | ICD-10-CM | POA: Diagnosis not present

## 2016-02-08 DIAGNOSIS — M1A09X0 Idiopathic chronic gout, multiple sites, without tophus (tophi): Secondary | ICD-10-CM | POA: Diagnosis not present

## 2016-04-30 DIAGNOSIS — M1A09X0 Idiopathic chronic gout, multiple sites, without tophus (tophi): Secondary | ICD-10-CM | POA: Diagnosis not present

## 2016-05-07 DIAGNOSIS — I499 Cardiac arrhythmia, unspecified: Secondary | ICD-10-CM | POA: Diagnosis not present

## 2016-05-07 DIAGNOSIS — M150 Primary generalized (osteo)arthritis: Secondary | ICD-10-CM | POA: Diagnosis not present

## 2016-05-07 DIAGNOSIS — M1A09X0 Idiopathic chronic gout, multiple sites, without tophus (tophi): Secondary | ICD-10-CM | POA: Diagnosis not present

## 2016-06-03 DIAGNOSIS — J209 Acute bronchitis, unspecified: Secondary | ICD-10-CM | POA: Diagnosis not present

## 2016-06-03 DIAGNOSIS — R030 Elevated blood-pressure reading, without diagnosis of hypertension: Secondary | ICD-10-CM | POA: Diagnosis not present

## 2016-06-26 DIAGNOSIS — D485 Neoplasm of uncertain behavior of skin: Secondary | ICD-10-CM | POA: Diagnosis not present

## 2016-06-26 DIAGNOSIS — C44519 Basal cell carcinoma of skin of other part of trunk: Secondary | ICD-10-CM | POA: Diagnosis not present

## 2016-06-26 DIAGNOSIS — D1801 Hemangioma of skin and subcutaneous tissue: Secondary | ICD-10-CM | POA: Diagnosis not present

## 2016-06-26 DIAGNOSIS — L570 Actinic keratosis: Secondary | ICD-10-CM | POA: Diagnosis not present

## 2016-06-26 DIAGNOSIS — Z85828 Personal history of other malignant neoplasm of skin: Secondary | ICD-10-CM | POA: Diagnosis not present

## 2016-06-26 DIAGNOSIS — L98499 Non-pressure chronic ulcer of skin of other sites with unspecified severity: Secondary | ICD-10-CM | POA: Diagnosis not present

## 2016-06-26 DIAGNOSIS — L814 Other melanin hyperpigmentation: Secondary | ICD-10-CM | POA: Diagnosis not present

## 2016-06-26 DIAGNOSIS — L821 Other seborrheic keratosis: Secondary | ICD-10-CM | POA: Diagnosis not present

## 2016-08-07 DIAGNOSIS — I499 Cardiac arrhythmia, unspecified: Secondary | ICD-10-CM | POA: Diagnosis not present

## 2016-08-07 DIAGNOSIS — M150 Primary generalized (osteo)arthritis: Secondary | ICD-10-CM | POA: Diagnosis not present

## 2016-08-07 DIAGNOSIS — M1A09X0 Idiopathic chronic gout, multiple sites, without tophus (tophi): Secondary | ICD-10-CM | POA: Diagnosis not present

## 2016-08-19 DIAGNOSIS — Z23 Encounter for immunization: Secondary | ICD-10-CM | POA: Diagnosis not present

## 2016-11-21 DIAGNOSIS — Z23 Encounter for immunization: Secondary | ICD-10-CM | POA: Diagnosis not present

## 2016-12-25 DIAGNOSIS — L821 Other seborrheic keratosis: Secondary | ICD-10-CM | POA: Diagnosis not present

## 2016-12-25 DIAGNOSIS — L814 Other melanin hyperpigmentation: Secondary | ICD-10-CM | POA: Diagnosis not present

## 2016-12-25 DIAGNOSIS — L570 Actinic keratosis: Secondary | ICD-10-CM | POA: Diagnosis not present

## 2016-12-25 DIAGNOSIS — D1801 Hemangioma of skin and subcutaneous tissue: Secondary | ICD-10-CM | POA: Diagnosis not present

## 2016-12-25 DIAGNOSIS — Z85828 Personal history of other malignant neoplasm of skin: Secondary | ICD-10-CM | POA: Diagnosis not present

## 2016-12-25 DIAGNOSIS — L2084 Intrinsic (allergic) eczema: Secondary | ICD-10-CM | POA: Diagnosis not present

## 2017-01-20 DIAGNOSIS — M150 Primary generalized (osteo)arthritis: Secondary | ICD-10-CM | POA: Diagnosis not present

## 2017-01-20 DIAGNOSIS — M1A09X0 Idiopathic chronic gout, multiple sites, without tophus (tophi): Secondary | ICD-10-CM | POA: Diagnosis not present

## 2017-01-21 DIAGNOSIS — S61031A Puncture wound without foreign body of right thumb without damage to nail, initial encounter: Secondary | ICD-10-CM | POA: Diagnosis not present

## 2017-01-23 DIAGNOSIS — E669 Obesity, unspecified: Secondary | ICD-10-CM | POA: Diagnosis not present

## 2017-01-23 DIAGNOSIS — M150 Primary generalized (osteo)arthritis: Secondary | ICD-10-CM | POA: Diagnosis not present

## 2017-01-23 DIAGNOSIS — S61111A Laceration without foreign body of right thumb with damage to nail, initial encounter: Secondary | ICD-10-CM | POA: Insufficient documentation

## 2017-01-23 DIAGNOSIS — M720 Palmar fascial fibromatosis [Dupuytren]: Secondary | ICD-10-CM | POA: Insufficient documentation

## 2017-01-23 DIAGNOSIS — I499 Cardiac arrhythmia, unspecified: Secondary | ICD-10-CM | POA: Diagnosis not present

## 2017-01-23 DIAGNOSIS — Z6832 Body mass index (BMI) 32.0-32.9, adult: Secondary | ICD-10-CM | POA: Diagnosis not present

## 2017-01-23 DIAGNOSIS — M1A09X0 Idiopathic chronic gout, multiple sites, without tophus (tophi): Secondary | ICD-10-CM | POA: Diagnosis not present

## 2017-01-28 ENCOUNTER — Encounter (HOSPITAL_BASED_OUTPATIENT_CLINIC_OR_DEPARTMENT_OTHER): Payer: Medicare Other | Attending: Surgery

## 2017-06-17 DIAGNOSIS — H25813 Combined forms of age-related cataract, bilateral: Secondary | ICD-10-CM | POA: Diagnosis not present

## 2017-06-17 DIAGNOSIS — H10413 Chronic giant papillary conjunctivitis, bilateral: Secondary | ICD-10-CM | POA: Diagnosis not present

## 2017-06-17 DIAGNOSIS — H04123 Dry eye syndrome of bilateral lacrimal glands: Secondary | ICD-10-CM | POA: Diagnosis not present

## 2017-06-23 DIAGNOSIS — L821 Other seborrheic keratosis: Secondary | ICD-10-CM | POA: Diagnosis not present

## 2017-06-23 DIAGNOSIS — D1801 Hemangioma of skin and subcutaneous tissue: Secondary | ICD-10-CM | POA: Diagnosis not present

## 2017-06-23 DIAGNOSIS — D485 Neoplasm of uncertain behavior of skin: Secondary | ICD-10-CM | POA: Diagnosis not present

## 2017-06-23 DIAGNOSIS — L570 Actinic keratosis: Secondary | ICD-10-CM | POA: Diagnosis not present

## 2017-06-23 DIAGNOSIS — Z85828 Personal history of other malignant neoplasm of skin: Secondary | ICD-10-CM | POA: Diagnosis not present

## 2017-06-23 DIAGNOSIS — L814 Other melanin hyperpigmentation: Secondary | ICD-10-CM | POA: Diagnosis not present

## 2017-06-23 DIAGNOSIS — L820 Inflamed seborrheic keratosis: Secondary | ICD-10-CM | POA: Diagnosis not present

## 2017-07-14 DIAGNOSIS — M1A09X0 Idiopathic chronic gout, multiple sites, without tophus (tophi): Secondary | ICD-10-CM | POA: Diagnosis not present

## 2017-07-24 DIAGNOSIS — E663 Overweight: Secondary | ICD-10-CM | POA: Diagnosis not present

## 2017-07-24 DIAGNOSIS — M1A09X0 Idiopathic chronic gout, multiple sites, without tophus (tophi): Secondary | ICD-10-CM | POA: Diagnosis not present

## 2017-07-24 DIAGNOSIS — I499 Cardiac arrhythmia, unspecified: Secondary | ICD-10-CM | POA: Diagnosis not present

## 2017-07-24 DIAGNOSIS — M150 Primary generalized (osteo)arthritis: Secondary | ICD-10-CM | POA: Diagnosis not present

## 2017-07-24 DIAGNOSIS — Z6829 Body mass index (BMI) 29.0-29.9, adult: Secondary | ICD-10-CM | POA: Diagnosis not present

## 2017-07-30 DIAGNOSIS — Z23 Encounter for immunization: Secondary | ICD-10-CM | POA: Diagnosis not present

## 2017-12-12 DIAGNOSIS — Z23 Encounter for immunization: Secondary | ICD-10-CM | POA: Diagnosis not present

## 2018-01-01 DIAGNOSIS — L578 Other skin changes due to chronic exposure to nonionizing radiation: Secondary | ICD-10-CM | POA: Diagnosis not present

## 2018-01-01 DIAGNOSIS — Z85828 Personal history of other malignant neoplasm of skin: Secondary | ICD-10-CM | POA: Diagnosis not present

## 2018-01-01 DIAGNOSIS — B353 Tinea pedis: Secondary | ICD-10-CM | POA: Diagnosis not present

## 2018-01-01 DIAGNOSIS — L820 Inflamed seborrheic keratosis: Secondary | ICD-10-CM | POA: Diagnosis not present

## 2018-01-12 DIAGNOSIS — M1A09X0 Idiopathic chronic gout, multiple sites, without tophus (tophi): Secondary | ICD-10-CM | POA: Diagnosis not present

## 2018-01-22 DIAGNOSIS — E663 Overweight: Secondary | ICD-10-CM | POA: Diagnosis not present

## 2018-01-22 DIAGNOSIS — M1A09X0 Idiopathic chronic gout, multiple sites, without tophus (tophi): Secondary | ICD-10-CM | POA: Diagnosis not present

## 2018-01-22 DIAGNOSIS — I499 Cardiac arrhythmia, unspecified: Secondary | ICD-10-CM | POA: Diagnosis not present

## 2018-01-22 DIAGNOSIS — Z6829 Body mass index (BMI) 29.0-29.9, adult: Secondary | ICD-10-CM | POA: Diagnosis not present

## 2018-01-22 DIAGNOSIS — R252 Cramp and spasm: Secondary | ICD-10-CM | POA: Diagnosis not present

## 2018-01-22 DIAGNOSIS — M150 Primary generalized (osteo)arthritis: Secondary | ICD-10-CM | POA: Diagnosis not present

## 2018-06-17 DIAGNOSIS — H25813 Combined forms of age-related cataract, bilateral: Secondary | ICD-10-CM | POA: Diagnosis not present

## 2018-06-17 DIAGNOSIS — H04123 Dry eye syndrome of bilateral lacrimal glands: Secondary | ICD-10-CM | POA: Diagnosis not present

## 2018-06-17 DIAGNOSIS — H35371 Puckering of macula, right eye: Secondary | ICD-10-CM | POA: Diagnosis not present

## 2018-06-17 DIAGNOSIS — H17822 Peripheral opacity of cornea, left eye: Secondary | ICD-10-CM | POA: Diagnosis not present

## 2018-06-30 DIAGNOSIS — Z85828 Personal history of other malignant neoplasm of skin: Secondary | ICD-10-CM | POA: Diagnosis not present

## 2018-06-30 DIAGNOSIS — L578 Other skin changes due to chronic exposure to nonionizing radiation: Secondary | ICD-10-CM | POA: Diagnosis not present

## 2018-06-30 DIAGNOSIS — D2339 Other benign neoplasm of skin of other parts of face: Secondary | ICD-10-CM | POA: Diagnosis not present

## 2018-06-30 DIAGNOSIS — L570 Actinic keratosis: Secondary | ICD-10-CM | POA: Diagnosis not present

## 2018-06-30 DIAGNOSIS — L820 Inflamed seborrheic keratosis: Secondary | ICD-10-CM | POA: Diagnosis not present

## 2018-07-14 DIAGNOSIS — Z23 Encounter for immunization: Secondary | ICD-10-CM | POA: Diagnosis not present

## 2018-07-15 DIAGNOSIS — M1A09X0 Idiopathic chronic gout, multiple sites, without tophus (tophi): Secondary | ICD-10-CM | POA: Diagnosis not present

## 2018-07-30 DIAGNOSIS — M1A09X0 Idiopathic chronic gout, multiple sites, without tophus (tophi): Secondary | ICD-10-CM | POA: Diagnosis not present

## 2018-07-30 DIAGNOSIS — Z6828 Body mass index (BMI) 28.0-28.9, adult: Secondary | ICD-10-CM | POA: Diagnosis not present

## 2018-07-30 DIAGNOSIS — I499 Cardiac arrhythmia, unspecified: Secondary | ICD-10-CM | POA: Diagnosis not present

## 2018-07-30 DIAGNOSIS — E663 Overweight: Secondary | ICD-10-CM | POA: Diagnosis not present

## 2018-07-30 DIAGNOSIS — M150 Primary generalized (osteo)arthritis: Secondary | ICD-10-CM | POA: Diagnosis not present

## 2018-07-30 DIAGNOSIS — R252 Cramp and spasm: Secondary | ICD-10-CM | POA: Diagnosis not present

## 2018-10-17 DIAGNOSIS — J189 Pneumonia, unspecified organism: Secondary | ICD-10-CM | POA: Diagnosis not present

## 2019-02-26 DIAGNOSIS — C44612 Basal cell carcinoma of skin of right upper limb, including shoulder: Secondary | ICD-10-CM | POA: Diagnosis not present

## 2019-02-26 DIAGNOSIS — D485 Neoplasm of uncertain behavior of skin: Secondary | ICD-10-CM | POA: Diagnosis not present

## 2019-02-26 DIAGNOSIS — C44319 Basal cell carcinoma of skin of other parts of face: Secondary | ICD-10-CM | POA: Diagnosis not present

## 2019-02-26 DIAGNOSIS — C4441 Basal cell carcinoma of skin of scalp and neck: Secondary | ICD-10-CM | POA: Diagnosis not present

## 2019-02-26 DIAGNOSIS — L218 Other seborrheic dermatitis: Secondary | ICD-10-CM | POA: Diagnosis not present

## 2019-02-26 DIAGNOSIS — Z85828 Personal history of other malignant neoplasm of skin: Secondary | ICD-10-CM | POA: Diagnosis not present

## 2019-02-26 DIAGNOSIS — C44519 Basal cell carcinoma of skin of other part of trunk: Secondary | ICD-10-CM | POA: Diagnosis not present

## 2019-02-26 DIAGNOSIS — L821 Other seborrheic keratosis: Secondary | ICD-10-CM | POA: Diagnosis not present

## 2019-03-24 MED FILL — ALLOPURINOL 100 MG TABS: 100 | 90 days supply | Qty: 180 | Fill #0

## 2019-04-13 DIAGNOSIS — M1A09X0 Idiopathic chronic gout, multiple sites, without tophus (tophi): Secondary | ICD-10-CM | POA: Diagnosis not present

## 2019-04-27 DIAGNOSIS — M150 Primary generalized (osteo)arthritis: Secondary | ICD-10-CM | POA: Diagnosis not present

## 2019-04-27 DIAGNOSIS — M1A09X0 Idiopathic chronic gout, multiple sites, without tophus (tophi): Secondary | ICD-10-CM | POA: Diagnosis not present

## 2019-04-27 DIAGNOSIS — I499 Cardiac arrhythmia, unspecified: Secondary | ICD-10-CM | POA: Diagnosis not present

## 2019-04-27 DIAGNOSIS — E663 Overweight: Secondary | ICD-10-CM | POA: Diagnosis not present

## 2019-04-27 DIAGNOSIS — R252 Cramp and spasm: Secondary | ICD-10-CM | POA: Diagnosis not present

## 2019-04-27 DIAGNOSIS — Z6828 Body mass index (BMI) 28.0-28.9, adult: Secondary | ICD-10-CM | POA: Diagnosis not present

## 2019-06-15 DIAGNOSIS — Z23 Encounter for immunization: Secondary | ICD-10-CM | POA: Diagnosis not present

## 2019-11-06 ENCOUNTER — Ambulatory Visit: Payer: Self-pay

## 2019-11-11 ENCOUNTER — Ambulatory Visit: Payer: Self-pay

## 2019-12-31 ENCOUNTER — Telehealth: Payer: Self-pay | Admitting: Orthopaedic Surgery

## 2019-12-31 NOTE — Telephone Encounter (Signed)
Pt called in requesting a call back in regards to putting warranty on one of the hip he had done with dr.blackman??   [phone]

## 2020-01-03 NOTE — Telephone Encounter (Signed)
IC s/w patient and offered an appointment for follow up. He states he has been having a lot of issues with his hip. I scheduled him to see Dr Ninfa Linden.

## 2020-01-26 ENCOUNTER — Other Ambulatory Visit: Payer: Self-pay

## 2020-01-26 ENCOUNTER — Ambulatory Visit (INDEPENDENT_AMBULATORY_CARE_PROVIDER_SITE_OTHER): Payer: Medicare PPO

## 2020-01-26 ENCOUNTER — Ambulatory Visit (INDEPENDENT_AMBULATORY_CARE_PROVIDER_SITE_OTHER): Payer: Medicare PPO | Admitting: Orthopaedic Surgery

## 2020-01-26 DIAGNOSIS — Z96643 Presence of artificial hip joint, bilateral: Secondary | ICD-10-CM

## 2020-01-26 DIAGNOSIS — M5432 Sciatica, left side: Secondary | ICD-10-CM

## 2020-01-26 MED ORDER — DICLOFENAC SODIUM 75 MG PO TBEC: 75.0000 mg | DELAYED_RELEASE_TABLET | Freq: Two times a day (BID) | ORAL | 1 refills | Status: DC | PRN

## 2020-01-26 MED ORDER — METHOCARBAMOL 500 MG PO TABS: 500.0000 mg | ORAL_TABLET | Freq: Three times a day (TID) | ORAL | 1 refills | Status: DC | PRN

## 2020-01-26 MED ORDER — METHYLPREDNISOLONE 4 MG PO TABS: ORAL_TABLET | ORAL | 0 refills | Status: DC

## 2020-01-26 NOTE — Progress Notes (Signed)
Office Visit Note   Patient: Jose Robles           Date of Birth: January 17, 1946           MRN: [id number] Visit Date: 01/26/2020              Requested by: No referring provider defined for this encounter. PCP: Patient, No Pcp Per   Assessment & Plan: Visit Diagnoses:  1. Sciatica, left side   2. H/O bilateral hip replacements   3. Status post bilateral total hip replacement     Plan: At this point he would like a one-time physical therapy appointment for home exercise program.  He has been doing some sit ups on his own and that may have aggravated his back so I do feel he needs at least a physical therapy showing him appropriate technique for core strengthening and other extension types of exercises that can help with his back.  I gave him reassurance that the hip replacements look good.  I will try combination of a 6-day steroid taper as well as Robaxin and diclofenac to see if this will help with his symptoms.  All questions and concerns were answered and addressed.  If this seems to worsen in any way he will let us know.  Physical therapy with a one-time visit will hopefully also help him.  If things do not seem to help we will consider a MRI of the lumbar spine.  Follow-Up Instructions: Return if symptoms worsen or fail to improve.   Orders:  Orders Placed This Encounter  Procedures  . XR HIPS BILAT W OR W/O PELVIS 2V  . XR Lumbar Spine 2-3 Views   Meds ordered this encounter  Medications  . methylPREDNISolone (MEDROL) 4 MG tablet    Sig: Medrol dose pack. Take as instructed    Dispense:  21 tablet    Refill:  0  . methocarbamol (ROBAXIN) 500 MG tablet    Sig: Take 1 tablet (500 mg total) by mouth every 8 (eight) hours as needed for muscle spasms.    Dispense:  60 tablet    Refill:  1  . diclofenac (VOLTAREN) 75 MG EC tablet    Sig: Take 1 tablet (75 mg total) by mouth 2 (two) times daily between meals as needed.    Dispense:  60 tablet    Refill:  1      Procedures: No procedures performed   Clinical Data: No additional findings.   Subjective: Chief Complaint  Patient presents with  . Lower Back - Pain  . Left Hip - Pain  . Right Hip - Pain  Patient is a very pleasant 74 year old gentleman who we performed bilateral hip arthroplasties on in November 2015.  He says been having pain in both his hips with really his low back and going down the back into the sciatic region on both sides for just a few months now.  He states that he has some sciatica type of pain and is fallen several times in the last couple years.  He says sometimes he cannot walk due to the pain.  He is very active and he does not walk with assistive device.  He is not diabetic either.  He is try some over-the-counter anti-inflammatories but he still experiencing the same pain.  It seems to radiate the groin and the lateral aspect of the right hip but also the sciatic area on both sides.  He denies any acute changes in medical status  HPI  Review of Systems He currently denies any headache, chest pain, shortness of breath, fever, chills, nausea, vomiting  Objective: Vital Signs: There were no vitals taken for this visit.  Physical Exam He is alert and orient x3 and in no acute distress Ortho Exam Examination of both hips shows the move smoothly and fluidly with no blocks to rotation or pain with rotation.  There is no pain to palpation over the trochanteric area of either hip.  There is no pain to compressing either hip.  He does have pain lower aspect of his lumbar spine with flexion and extension.  This seems to radiate to the right side more and is a sciatic region.  He has excellent strength in his bilateral lower extremities. Specialty Comments:  No specialty comments available.  Imaging: XR HIPS BILAT W OR W/O PELVIS 2V  Result Date: 01/26/2020 A low AP pelvis and lateral both hips show well-seated total hip arthroplasties with no complicating features or  evidence of loosening or osteolysis  XR Lumbar Spine 2-3 Views  Result Date: 01/26/2020 2 views of the lumbar spine show degenerative changes at multiple levels    PMFS History: Patient Active Problem List   Diagnosis Date Noted  . Bilateral hip joint primary osteoarthritis 09/02/2014  . Status post bilateral total hip replacement 09/02/2014   Past Medical History:  Diagnosis Date  . Arthritis   . Cancer    hx skin cancer  . Gout   . History of skin cancer   . Nocturnal leg cramps    early in am- different parts of legs at different times. Relieved by mustard and ambulation    No family history on file.  Past Surgical History:  Procedure Laterality Date  . ANKLE ARTHROTOMY  1993  . BILATERAL ANTERIOR TOTAL HIP ARTHROPLASTY Bilateral 09/02/2014   Procedure: BILATERAL ANTERIOR TOTAL HIP ARTHROPLASTY;  Surgeon: Mcarthur Rossetti, MD;  Location: [facility];  Service: Orthopedics;  Laterality: Bilateral;  . KNEE ARTHROSCOPY  1993   Social History   Occupational History  . Not on file  Tobacco Use  . Smoking status: Former Smoker    Quit date: 08/29/2009    Years since quitting: 10.4  Substance and Sexual Activity  . Alcohol use: Yes    Comment: 6 times /wk  . Drug use: No  . Sexual activity: Not on file

## 2020-02-07 ENCOUNTER — Other Ambulatory Visit (HOSPITAL_COMMUNITY): Payer: Self-pay | Admitting: Internal Medicine

## 2020-06-07 ENCOUNTER — Other Ambulatory Visit (HOSPITAL_COMMUNITY): Payer: Self-pay

## 2020-08-30 ENCOUNTER — Other Ambulatory Visit (HOSPITAL_COMMUNITY): Payer: Self-pay | Admitting: Internal Medicine

## 2021-01-17 ENCOUNTER — Ambulatory Visit: Payer: Medicare Other | Attending: Internal Medicine

## 2021-01-17 DIAGNOSIS — Z23 Encounter for immunization: Secondary | ICD-10-CM

## 2021-01-17 NOTE — Progress Notes (Signed)
   Covid-19 Vaccination Clinic  Name:  Jose Robles    MRN: [id number] DOB: 01/13/1946  01/17/2021  Jose Robles was observed post Covid-19 immunization for 15 minutes without incident. He was provided with Vaccine Information Sheet and instruction to access the V-Safe system.   Jose Robles was instructed to call 911 with any severe reactions post vaccine: Marland Kitchen Difficulty breathing  . Swelling of face and throat  . A fast heartbeat  . A bad rash all over body  . Dizziness and weakness   Immunizations Administered    Name Date Dose VIS Date Route   Moderna Covid-19 Booster Vaccine 01/17/2021 12:19 PM 0.25 mL 07/26/2020 Intramuscular   Manufacturer: Moderna   Lot: [id number]   [place]: 22449-753-00

## 2021-01-23 ENCOUNTER — Other Ambulatory Visit (HOSPITAL_COMMUNITY): Payer: Self-pay

## 2021-01-23 MED ORDER — COVID-19 MRNA VACC (MODERNA) 100 MCG/0.5ML IM SUSP
INTRAMUSCULAR | 0 refills | Status: DC
  Filled 2021-01-23: qty 0.5, 17d supply, fill #0

## 2021-01-24 ENCOUNTER — Other Ambulatory Visit (HOSPITAL_COMMUNITY): Payer: Self-pay

## 2021-02-20 ENCOUNTER — Other Ambulatory Visit (HOSPITAL_COMMUNITY): Payer: Self-pay

## 2021-02-20 ENCOUNTER — Other Ambulatory Visit (HOSPITAL_COMMUNITY): Payer: Self-pay | Admitting: Internal Medicine

## 2021-02-26 ENCOUNTER — Other Ambulatory Visit (HOSPITAL_COMMUNITY): Payer: Self-pay | Admitting: Internal Medicine

## 2021-02-26 ENCOUNTER — Other Ambulatory Visit (HOSPITAL_COMMUNITY): Payer: Self-pay

## 2021-02-27 ENCOUNTER — Other Ambulatory Visit (HOSPITAL_COMMUNITY): Payer: Self-pay

## 2021-02-27 MED ORDER — ALLOPURINOL 100 MG PO TABS
ORAL_TABLET | ORAL | 3 refills | Status: DC
  Filled 2021-02-27 – 2021-06-12 (×2): qty 180, 90d supply, fill #0
  Filled 2021-06-12 – 2021-09-24 (×2): qty 180, 90d supply, fill #1
  Filled 2021-12-19: qty 180, 90d supply, fill #2

## 2021-02-27 MED ORDER — ALLOPURINOL 100 MG PO TABS
ORAL_TABLET | ORAL | 3 refills | Status: DC
  Filled 2021-02-27: qty 180, 90d supply, fill #0

## 2021-03-29 ENCOUNTER — Other Ambulatory Visit (HOSPITAL_COMMUNITY): Payer: Self-pay

## 2021-03-30 ENCOUNTER — Other Ambulatory Visit (HOSPITAL_COMMUNITY): Payer: Self-pay

## 2021-03-30 MED ORDER — SILDENAFIL CITRATE 20 MG PO TABS
ORAL_TABLET | ORAL | 5 refills | Status: DC
  Filled 2021-03-30: qty 100, 30d supply, fill #0
  Filled 2021-07-06: qty 100, 30d supply, fill #1
  Filled 2021-08-28: qty 100, 30d supply, fill #0
  Filled 2021-08-28: qty 100, 30d supply, fill #2
  Filled 2021-10-23: qty 100, 30d supply, fill #1
  Filled 2021-12-19: qty 100, 30d supply, fill #2
  Filled 2022-02-01: qty 100, 30d supply, fill #3

## 2021-04-04 ENCOUNTER — Other Ambulatory Visit (HOSPITAL_COMMUNITY): Payer: Self-pay

## 2021-06-04 ENCOUNTER — Encounter (INDEPENDENT_AMBULATORY_CARE_PROVIDER_SITE_OTHER): Payer: Medicare Other | Admitting: Ophthalmology

## 2021-06-05 ENCOUNTER — Encounter (INDEPENDENT_AMBULATORY_CARE_PROVIDER_SITE_OTHER): Payer: Medicare Other | Admitting: Ophthalmology

## 2021-06-07 ENCOUNTER — Encounter (INDEPENDENT_AMBULATORY_CARE_PROVIDER_SITE_OTHER): Payer: Medicare Other | Admitting: Ophthalmology

## 2021-06-07 ENCOUNTER — Encounter (INDEPENDENT_AMBULATORY_CARE_PROVIDER_SITE_OTHER): Payer: Medicare PPO | Admitting: Ophthalmology

## 2021-06-12 ENCOUNTER — Other Ambulatory Visit (HOSPITAL_COMMUNITY): Payer: Self-pay

## 2021-07-06 ENCOUNTER — Other Ambulatory Visit (HOSPITAL_COMMUNITY): Payer: Self-pay

## 2021-08-28 ENCOUNTER — Other Ambulatory Visit (HOSPITAL_COMMUNITY): Payer: Self-pay

## 2021-09-24 ENCOUNTER — Other Ambulatory Visit (HOSPITAL_COMMUNITY): Payer: Self-pay

## 2021-10-23 ENCOUNTER — Other Ambulatory Visit (HOSPITAL_COMMUNITY): Payer: Self-pay

## 2021-12-19 ENCOUNTER — Other Ambulatory Visit (HOSPITAL_COMMUNITY): Payer: Self-pay

## 2022-02-04 ENCOUNTER — Other Ambulatory Visit (HOSPITAL_COMMUNITY): Payer: Self-pay

## 2022-02-05 ENCOUNTER — Other Ambulatory Visit (HOSPITAL_COMMUNITY): Payer: Self-pay

## 2022-02-27 ENCOUNTER — Other Ambulatory Visit (HOSPITAL_COMMUNITY): Payer: Self-pay

## 2022-02-28 ENCOUNTER — Other Ambulatory Visit (HOSPITAL_COMMUNITY): Payer: Self-pay

## 2022-02-28 MED ORDER — SILDENAFIL CITRATE 20 MG PO TABS
40.0000 mg | ORAL_TABLET | ORAL | 5 refills | Status: DC
  Filled 2022-02-28: qty 100, 34d supply, fill #0
  Filled 2022-05-16: qty 100, 34d supply, fill #1
  Filled 2022-07-02: qty 100, 34d supply, fill #2
  Filled 2022-07-19 – 2022-08-01 (×2): qty 100, 34d supply, fill #3
  Filled 2022-09-04: qty 100, 34d supply, fill #4

## 2022-03-06 ENCOUNTER — Other Ambulatory Visit (HOSPITAL_COMMUNITY): Payer: Self-pay

## 2022-03-19 ENCOUNTER — Other Ambulatory Visit (HOSPITAL_COMMUNITY): Payer: Self-pay

## 2022-03-19 MED ORDER — ALLOPURINOL 100 MG PO TABS
200.0000 mg | ORAL_TABLET | Freq: Every day | ORAL | 3 refills | Status: DC
  Filled 2022-03-19: qty 180, 90d supply, fill #0
  Filled 2022-07-02: qty 180, 90d supply, fill #1
  Filled 2022-10-22: qty 180, 90d supply, fill #2
  Filled 2022-12-31: qty 30, 15d supply, fill #3
  Filled 2023-01-28: qty 30, 15d supply, fill #4
  Filled 2023-02-21: qty 30, 15d supply, fill #5
  Filled 2023-03-17: qty 30, 15d supply, fill #6

## 2022-03-22 ENCOUNTER — Other Ambulatory Visit (HOSPITAL_COMMUNITY): Payer: Self-pay

## 2022-05-16 ENCOUNTER — Other Ambulatory Visit (HOSPITAL_COMMUNITY): Payer: Self-pay

## 2022-05-20 ENCOUNTER — Other Ambulatory Visit (HOSPITAL_COMMUNITY): Payer: Self-pay

## 2022-06-05 ENCOUNTER — Encounter (INDEPENDENT_AMBULATORY_CARE_PROVIDER_SITE_OTHER): Payer: Self-pay

## 2022-06-27 ENCOUNTER — Ambulatory Visit (INDEPENDENT_AMBULATORY_CARE_PROVIDER_SITE_OTHER): Payer: Medicare PPO | Admitting: Ophthalmology

## 2022-06-27 DIAGNOSIS — H43813 Vitreous degeneration, bilateral: Secondary | ICD-10-CM

## 2022-06-27 DIAGNOSIS — H35371 Puckering of macula, right eye: Secondary | ICD-10-CM

## 2022-06-27 DIAGNOSIS — H2513 Age-related nuclear cataract, bilateral: Secondary | ICD-10-CM

## 2022-06-27 NOTE — Progress Notes (Signed)
06/27/2022     CHIEF COMPLAINT Patient presents for  Chief Complaint  Patient presents with   Retina Evaluation      HISTORY OF PRESENT ILLNESS: Jose Robles is a 76 y.o. male who presents to the clinic today for:   HPI     Retina Evaluation           Laterality: right eye   Associated Symptoms: Negative for Flashes, Floaters, Distortion, Blind Spot, Pain, Redness, Photophobia, Glare, Trauma, Scalp Tenderness, Jaw Claudication, Shoulder/Hip pain, Fever, Weight Loss and Fatigue         Comments   NP- ERM OD REF ROBERT GROAT R/S FROM 06/07/2021 Pt stated, "Well I was supposed to come see Dr. Zadie Rhine a year ago. But Dr. Katy Fitch said that my vision has gotten better. He wanted me to see Dr. Zadie Rhine quickly so that he can proceed with cataract surgery. But my vision is doing better. Now he doesn't see the need to continue with cataract sx."       Last edited by Silvestre Moment on 06/27/2022  1:20 PM.      Referring physician: No referring provider defined for this encounter.  HISTORICAL INFORMATION:   Selected notes from the MEDICAL RECORD NUMBER       CURRENT MEDICATIONS: No current outpatient medications on file. (Ophthalmic Drugs)   No current facility-administered medications for this visit. (Ophthalmic Drugs)   Current Outpatient Medications (Other)  Medication Sig   allopurinol (ZYLOPRIM) 100 MG tablet TAKE 2 TABLETS BY MOUTH ONCE A DAY   allopurinol (ZYLOPRIM) 100 MG tablet Take 2 tablets by mouth Once a day for 90 days   allopurinol (ZYLOPRIM) 100 MG tablet Take 2 tablets (200 mg total) by mouth daily.   aspirin EC 325 MG EC tablet Take 1 tablet (325 mg total) by mouth 2 (two) times daily after a meal.   COVID-19 mRNA vaccine, Moderna, 100 MCG/0.5ML injection Inject into the muscle.   diclofenac (VOLTAREN) 75 MG EC tablet Take 1 tablet (75 mg total) by mouth 2 (two) times daily between meals as needed.   methocarbamol (ROBAXIN) 500 MG tablet Take 1 tablet (500 mg  total) by mouth every 8 (eight) hours as needed for muscle spasms.   methylPREDNISolone (MEDROL) 4 MG tablet Medrol dose pack. Take as instructed   oxyCODONE-acetaminophen (ROXICET) 5-325 MG per tablet Take 1-2 tablets by mouth every 4 (four) hours as needed.   sildenafil (REVATIO) 20 MG tablet Take 2-3 tablets (40-60 mg total) by mouth as needed & as directed   No current facility-administered medications for this visit. (Other)      REVIEW OF SYSTEMS: ROS   Negative for: Constitutional, Gastrointestinal, Neurological, Skin, Genitourinary, Musculoskeletal, HENT, Endocrine, Cardiovascular, Eyes, Respiratory, Psychiatric, Allergic/Imm, Heme/Lymph Last edited by Silvestre Moment on 06/27/2022  1:20 PM.       ALLERGIES Allergies  Allergen Reactions   Adhesive [Tape] Rash   Doxycycline Rash   Penicillins Rash   Scopolamine Rash   Tetracyclines & Related Rash    PAST MEDICAL HISTORY Past Medical History:  Diagnosis Date   Arthritis    Cancer ([place])    hx skin cancer   Gout    History of skin cancer    Nocturnal leg cramps    early in am- different parts of legs at different times. Relieved by mustard and ambulation   Past Surgical History:  Procedure Laterality Date   ANKLE ARTHROTOMY  1993   BILATERAL ANTERIOR TOTAL HIP ARTHROPLASTY Bilateral  09/02/2014   Procedure: BILATERAL ANTERIOR TOTAL HIP ARTHROPLASTY;  Surgeon: Mcarthur Rossetti, MD;  Location: [facility];  Service: Orthopedics;  Laterality: Bilateral;   KNEE ARTHROSCOPY  1993    FAMILY HISTORY No family history on file.  SOCIAL HISTORY Social History   Tobacco Use   Smoking status: Former    Types: Cigarettes    Quit date: 08/29/2009    Years since quitting: 12.8  Substance Use Topics   Alcohol use: Yes    Comment: 6 times /wk   Drug use: No         OPHTHALMIC EXAM:  Base Eye Exam     Visual Acuity (ETDRS)       Right Left   Dist cc 20/25 -2 20/25 -2    Correction: Glasses         Tonometry  (Tonopen, 1:27 PM)       Right Left   Pressure 20 21         Pupils       Pupils APD   Right PERRL None   Left PERRL None         Visual Fields       Left Right    Full          Extraocular Movement       Right Left    Full, Ortho Full, Ortho         Dilation     Right eye: 1.0% Mydriacyl, 2.5% Phenylephrine @ 1:27 PM           Slit Lamp and Fundus Exam     External Exam       Right Left   External Normal Normal         Slit Lamp Exam       Right Left   Lids/Lashes Normal Normal   Conjunctiva/Sclera White and quiet White and quiet   Cornea Clear Clear   Anterior Chamber Deep and quiet Deep and quiet   Iris Round and reactive Round and reactive   Lens 2+ Nuclear sclerosis 2+ Nuclear sclerosis   Anterior Vitreous Normal Normal         Fundus Exam       Right Left   Posterior Vitreous Posterior vitreous detachment Posterior vitreous detachment   Disc Normal Normal   C/D Ratio 0.3 0.3   Macula Epiretinal membrane, moderate to severe topographic distortion Normal   Vessels Normal Normal   Periphery Normal Normal            IMAGING AND PROCEDURES  Imaging and Procedures for 06/27/22  OCT, Retina - OU - Both Eyes       Right Eye Quality was good. Scan locations included subfoveal. Central Foveal Thickness: 382. Progression has no prior data. Findings include abnormal foveal contour, epiretinal membrane.   Left Eye Quality was good. Scan locations included subfoveal. Central Foveal Thickness: 274. Progression has no prior data. Findings include normal foveal contour.   Notes Moderate severe retinal thickening in the macular region from epiretinal membrane.  No intraretinal schisis nor outer photoreceptor layer disturbances right eye.     Color Fundus Photography Optos - OU - Both Eyes       Right Eye Progression has no prior data. Macula : epiretinal membrane. Vessels : normal observations. Periphery : normal observations.    Left Eye Progression has no prior data. Disc findings include normal observations. Macula : normal observations. Vessels : normal observations. Periphery : normal observations.  Notes ILM striae well seen on read free photos.  OD              ASSESSMENT/PLAN:  Nuclear sclerotic cataract of both eyes Moderate, tosignificant NSC changes in each eye.  Patient currently still have trouble with nighttime vision difficulty with driving on dark rainy days.  Thus not symptomatic from these color changes to the current lenses.  Also still shoots competitively.  With this combination of impacts on the visual system not impacting his activities of daily living, the cataract and the epiretinal membrane not impacting his function thus I do not recommend intervention in either case  Right epiretinal membrane The nature of macular pucker (epiretinal membrane ERM) was discussed with the patient as well as threshold criteria for vitrectomy surgery. I explained that in rare cases another surgery is needed to actually remove a second wrinkle should it regrow.  Most often, the epiretinal membrane and underlying wrinkled internal limiting membrane are removed with the first surgery, to accomplish the goals.   If the operative eye is Phakic (natural lens still present), cataract surgery is often recommended prior to Vitrectomy. This will enable the retina surgeon to have the best view during surgery and the patient to obtain optimal results in the future. Treatment options were discussed.  I have recommended at home monitoring the near vision task in a monocular (1 eye at a time), with or without near vision glasses, to look for changes or declines in reading.  OD, has been present for over 1 year.  Nonetheless patient is still not symptomatic thus I recommend follow-up 6 to 12 months with Dr. Katy Fitch as scheduled and here as needed or in 1 to 2 years    Posterior vitreous detachment of both eyes   The nature of posterior vitreous detachment was discussed with the patient as well as its physiology, its age prevalence, and its possible implication regarding retinal breaks and detachment.  An informational brochure was offered to the patient.  All the patient's questions were answered.  The patient was asked to return if new or different flashes or floaters develops.   Patient was instructed to contact office immediately if any new changes were noticed. I explained to the patient that vitreous inside the eye is similar to jello inside a bowl. As the jello melts it can start to pull away from the bowl, similarly the vitreous throughout our lives can begin to pull away from the retina. That process is called a posterior vitreous detachment. In some cases, the vitreous can tug hard enough on the retina to form a retinal tear. I discussed with the patient the signs and symptoms of a retinal detachment.  Do not rub the eye.       ICD-10-CM   1. Right epiretinal membrane  H35.371 OCT, Retina - OU - Both Eyes    Color Fundus Photography Optos - OU - Both Eyes    2. Nuclear sclerotic cataract of both eyes  H25.13     3. Posterior vitreous detachment of both eyes  H43.813       1.  Bilateral NSC cataract.  Surprisingly not symptomatic currently to the patient.  Symptoms reviewed.  Sunglasses analogy discussed.  2.  Topographic distortion of right eye from epiretinal membrane but no intrinsic retinal damage thus no subfoveal macular schisis nor photoreceptor disturbances.  Thus I recommend observe as the patient is not symptomatic  3.  OU follow-up with Dr. Katy Fitch as scheduled.  Ophthalmic Meds Ordered  this visit:  No orders of the defined types were placed in this encounter.      Return in about 1 year (around 06/28/2023) for DILATE OU, OCT.  There are no Patient Instructions on file for this visit.   Explained the diagnoses, plan, and follow up with the patient and they expressed  understanding.  Patient expressed understanding of the importance of proper follow up care.   Clent Demark Amun Stemm M.D. Diseases & Surgery of the Retina and Vitreous [facility] [place] 06/27/22     Abbreviations: M myopia (nearsighted); A astigmatism; H hyperopia (farsighted); P presbyopia; Mrx spectacle prescription;  CTL contact lenses; OD right eye; OS left eye; OU both eyes  XT exotropia; ET esotropia; PEK punctate epithelial keratitis; PEE punctate epithelial erosions; DES dry eye syndrome; MGD meibomian gland dysfunction; ATs artificial tears; PFAT's preservative free artificial tears; [place] nuclear sclerotic cataract; PSC posterior subcapsular cataract; ERM epi-retinal membrane; PVD posterior vitreous detachment; RD retinal detachment; DM diabetes mellitus; DR diabetic retinopathy; NPDR non-proliferative diabetic retinopathy; PDR proliferative diabetic retinopathy; CSME clinically significant macular edema; DME diabetic macular edema; dbh dot blot hemorrhages; CWS cotton wool spot; POAG primary open angle glaucoma; C/D cup-to-disc ratio; HVF humphrey visual field; GVF goldmann visual field; OCT optical coherence tomography; IOP intraocular pressure; BRVO Branch retinal vein occlusion; CRVO central retinal vein occlusion; CRAO central retinal artery occlusion; BRAO branch retinal artery occlusion; RT retinal tear; SB scleral buckle; PPV pars plana vitrectomy; VH Vitreous hemorrhage; PRP panretinal laser photocoagulation; IVK intravitreal kenalog; VMT vitreomacular traction; MH Macular hole;  NVD neovascularization of the disc; NVE neovascularization elsewhere; AREDS age related eye disease study; ARMD age related macular degeneration; POAG primary open angle glaucoma; EBMD epithelial/anterior basement membrane dystrophy; ACIOL anterior chamber intraocular lens; IOL intraocular lens; PCIOL posterior chamber intraocular lens; Phaco/IOL phacoemulsification with intraocular lens placement; [place]  photorefractive keratectomy; LASIK laser assisted in situ keratomileusis; HTN hypertension; DM diabetes mellitus; COPD chronic obstructive pulmonary disease

## 2022-06-27 NOTE — Assessment & Plan Note (Signed)

## 2022-06-27 NOTE — Assessment & Plan Note (Signed)
Moderate, tosignificant NSC changes in each eye.  Patient currently still have trouble with nighttime vision difficulty with driving on dark rainy days.  Thus not symptomatic from these color changes to the current lenses.  Also still shoots competitively.  With this combination of impacts on the visual system not impacting his activities of daily living, the cataract and the epiretinal membrane not impacting his function thus I do not recommend intervention in either case

## 2022-06-27 NOTE — Assessment & Plan Note (Addendum)
The nature of macular pucker (epiretinal membrane ERM) was discussed with the patient as well as threshold criteria for vitrectomy surgery. I explained that in rare cases another surgery is needed to actually remove a second wrinkle should it regrow.  Most often, the epiretinal membrane and underlying wrinkled internal limiting membrane are removed with the first surgery, to accomplish the goals.   If the operative eye is Phakic (natural lens still present), cataract surgery is often recommended prior to Vitrectomy. This will enable the retina surgeon to have the best view during surgery and the patient to obtain optimal results in the future. Treatment options were discussed.  I have recommended at home monitoring the near vision task in a monocular (1 eye at a time), with or without near vision glasses, to look for changes or declines in reading.  OD, has been present for over 1 year.  Nonetheless patient is still not symptomatic thus I recommend follow-up 6 to 12 months with Dr. Katy Fitch as scheduled and here as needed or in 1 to 2 years

## 2022-07-02 ENCOUNTER — Other Ambulatory Visit (HOSPITAL_COMMUNITY): Payer: Self-pay

## 2022-07-03 ENCOUNTER — Other Ambulatory Visit (HOSPITAL_COMMUNITY): Payer: Self-pay

## 2022-07-11 ENCOUNTER — Other Ambulatory Visit (HOSPITAL_COMMUNITY): Payer: Self-pay

## 2022-07-12 ENCOUNTER — Other Ambulatory Visit (HOSPITAL_COMMUNITY): Payer: Self-pay

## 2022-07-12 MED ORDER — SILDENAFIL CITRATE 20 MG PO TABS
80.0000 mg | ORAL_TABLET | Freq: Every day | ORAL | 99 refills | Status: DC | PRN
  Filled 2022-07-12 – 2022-10-22 (×2): qty 120, 30d supply, fill #0
  Filled 2022-12-31: qty 120, 30d supply, fill #1
  Filled 2023-01-28: qty 120, 30d supply, fill #2
  Filled 2023-02-21: qty 120, 30d supply, fill #3
  Filled 2023-03-25: qty 120, 30d supply, fill #4
  Filled 2023-06-06: qty 120, 30d supply, fill #5

## 2022-07-12 MED ORDER — FLUOROURACIL 5 % EX CREA
TOPICAL_CREAM | Freq: Two times a day (BID) | CUTANEOUS | 0 refills | Status: AC
  Filled 2022-07-12: qty 40, 30d supply, fill #0

## 2022-07-12 MED ORDER — FLUOCINONIDE 0.05 % EX CREA
TOPICAL_CREAM | Freq: Two times a day (BID) | CUTANEOUS | 0 refills | Status: DC
  Filled 2022-07-12: qty 30, 30d supply, fill #0

## 2022-07-15 ENCOUNTER — Other Ambulatory Visit (HOSPITAL_COMMUNITY): Payer: Self-pay

## 2022-07-19 ENCOUNTER — Other Ambulatory Visit (HOSPITAL_COMMUNITY): Payer: Self-pay

## 2022-07-22 ENCOUNTER — Other Ambulatory Visit (HOSPITAL_COMMUNITY): Payer: Self-pay

## 2022-07-22 MED ORDER — CALCIPOTRIENE 0.005 % EX CREA
1.0000 | TOPICAL_CREAM | Freq: Two times a day (BID) | CUTANEOUS | 0 refills | Status: DC
  Filled 2022-07-22 (×2): qty 60, 30d supply, fill #0

## 2022-08-01 ENCOUNTER — Other Ambulatory Visit (HOSPITAL_COMMUNITY): Payer: Self-pay

## 2022-08-08 ENCOUNTER — Other Ambulatory Visit (HOSPITAL_COMMUNITY): Payer: Self-pay

## 2022-09-04 ENCOUNTER — Other Ambulatory Visit (HOSPITAL_COMMUNITY): Payer: Self-pay

## 2022-09-05 ENCOUNTER — Other Ambulatory Visit (HOSPITAL_COMMUNITY): Payer: Self-pay

## 2022-09-09 ENCOUNTER — Ambulatory Visit (INDEPENDENT_AMBULATORY_CARE_PROVIDER_SITE_OTHER): Payer: Medicare PPO

## 2022-09-09 ENCOUNTER — Ambulatory Visit (INDEPENDENT_AMBULATORY_CARE_PROVIDER_SITE_OTHER): Payer: Medicare PPO | Admitting: Sports Medicine

## 2022-09-09 ENCOUNTER — Encounter: Payer: Self-pay | Admitting: Sports Medicine

## 2022-09-09 DIAGNOSIS — R0781 Pleurodynia: Secondary | ICD-10-CM

## 2022-09-09 DIAGNOSIS — S2231XA Fracture of one rib, right side, initial encounter for closed fracture: Secondary | ICD-10-CM | POA: Diagnosis not present

## 2022-09-09 MED ORDER — DICLOFENAC SODIUM 75 MG PO TBEC: 75.0000 mg | DELAYED_RELEASE_TABLET | Freq: Two times a day (BID) | ORAL | 0 refills | Status: DC | PRN

## 2022-09-09 NOTE — Progress Notes (Signed)
Jose Robles - 76 y.o. male MRN [id number]  Date of birth: 1946-08-25  Office Visit Note: Visit Date: 09/09/2022 PCP: Patient, No Pcp Per Referred by: Jose Barman, DO  Subjective: Chief Complaint  Patient presents with   Chest - Pain   HPI: Jose Robles is a pleasant 76 y.o. male who presents today for left rib cage pain after fall.  [name] was up working on a roof when he slipped and fell and landed on a wooden broom.  Landed onto his left side.  Had immediate pain overlying the lateral ribs.  He did note some bruising at that time.  It does hurt to move or twist about the torso, or take a deep breath.  Currently not taking any medication at this time for it.   Pertinent ROS were reviewed with the patient and found to be negative unless otherwise specified above in HPI.   Assessment & Plan: Visit Diagnoses:  1. Rib pain on left side   2. Closed fracture of one rib of right side, initial encounter    Plan: Discussed with Jose Robles that it does appear he has a nondisplaced left eighth rib fracture, possible that rib 9 is also broken.  Neither of these are nondisplaced.  Discussed conservative management for this.  Encouraged him to take deep breathing exercises at least 3 times a day, recommended incentive spirometry or blowing up a balloon to help prevent against secondary pneumonia.  Did refill diclofenac that he may take as needed for pain.  Also recommended topical lidocaine patches.  Would expect this to improve over the next 6 weeks.  He will follow-up with me in 1 month if his pain is not at least 50% improved.  Return precautions provided for any symptoms of respiratory distress or possible lung infection.  Follow-up: Return if symptoms worsen or fail to improve.   Meds & Orders:  Meds ordered this encounter  Medications   diclofenac (VOLTAREN) 75 MG EC tablet    Sig: Take 1 tablet (75 mg total) by mouth 2 (two) times daily between meals as needed.    Dispense:  60 tablet     Refill:  0    Orders Placed This Encounter  Procedures   XR Ribs Unilateral Left     Procedures: No procedures performed      Clinical History: No specialty comments available.  He reports that he quit smoking about 13 years ago. He does not have any smokeless tobacco history on file. No results for input(s): "HGBA1C", "LABURIC" in the last 8760 hours.  Objective:   Vital Signs: There were no vitals taken for this visit.  Physical Exam  Gen: Well-appearing, in no acute distress; non-toxic CV: Regular Rate. Well-perfused. Warm.  Resp: Breathing unlabored on room air; no wheezing. Psych: Fluid speech in conversation; appropriate affect; normal thought process Neuro: Sensation intact throughout. No gross coordination deficits.   Ortho Exam - Chest/Rib cage: + TTP over the anterior aspect of ribs 8 and 9.  Very trace bruising just inferiorly to this.  No rib step-off.  Able to take a full breath, albeit with pain.  No respiratory distress.  Ginger range of motion with rotation of the torso, able to perform flexion and extension of the spine.  Imaging: XR Ribs Unilateral Left  Result Date: 09/09/2022 3 views of the ribs were obtained including PA and oblique views.  There is a marker overlying the ninth rib.  X-rays demonstrate a cortical irregularity just medially to the lateral  rib 8 which likely reflects a nondisplaced fracture.  There is a possible nondisplaced left rib 9 fracture, although this is clouded by interstitial lung markings.  No displacement of any ribs.   Past Medical/Family/Surgical/Social History: Medications & Allergies reviewed per EMR, new medications updated. Patient Active Problem List   Diagnosis Date Noted   Nuclear sclerotic cataract of both eyes 06/27/2022   Right epiretinal membrane 06/27/2022   Posterior vitreous detachment of both eyes 06/27/2022   Bilateral hip joint primary osteoarthritis 09/02/2014   Status post bilateral total hip replacement  09/02/2014   Past Medical History:  Diagnosis Date   Arthritis    Cancer ([place])    hx skin cancer   Gout    History of skin cancer    Nocturnal leg cramps    early in am- different parts of legs at different times. Relieved by mustard and ambulation   No family history on file. Past Surgical History:  Procedure Laterality Date   ANKLE ARTHROTOMY  1993   BILATERAL ANTERIOR TOTAL HIP ARTHROPLASTY Bilateral 09/02/2014   Procedure: BILATERAL ANTERIOR TOTAL HIP ARTHROPLASTY;  Surgeon: Mcarthur Rossetti, MD;  Location: [facility];  Service: Orthopedics;  Laterality: Bilateral;   KNEE ARTHROSCOPY  1993   Social History   Occupational History   Not on file  Tobacco Use   Smoking status: Former    Types: Cigarettes    Quit date: 08/29/2009    Years since quitting: 13.0   Smokeless tobacco: Not on file  Substance and Sexual Activity   Alcohol use: Yes    Comment: 6 times /wk   Drug use: No   Sexual activity: Not on file

## 2022-09-09 NOTE — Progress Notes (Signed)
Had a fall on Saturday  Thinks he may have broken a rib Painful to move, take a breath

## 2022-09-09 NOTE — Patient Instructions (Signed)
Jose Robles, it was great to see you today, thank you for letting me participate in your care.  Today, we discussed your broken rib. X-rays did show that your broke your right rib 8. Possible that you also broke rib 9, but x-ray is inconclusive. These are non-displaced.  Things for you to do:  -You may take diclofenac once or twice daily only as needed for pain -Make sure you are taking deep breaths at least 3 times a day.  You may use an incentive spirometer for blowing up a balloon 10 times.  Do this to keep your lungs open and prevent against pneumonia Other treatment options would be considering a lidocaine patch that you may get over-the-counter at the pharmacy  You will follow-up with me in 1 month if your pain is not at least 50% improved.  If you have any further questions, please give the clinic a call [phone].  Dr. Elba Barman, DO Primary Care Sports Medicine Physician  [place]

## 2022-09-11 ENCOUNTER — Other Ambulatory Visit (HOSPITAL_COMMUNITY): Payer: Self-pay

## 2022-09-11 ENCOUNTER — Other Ambulatory Visit: Payer: Self-pay | Admitting: Orthopaedic Surgery

## 2022-09-11 MED ORDER — DICLOFENAC SODIUM 75 MG PO TBEC
75.0000 mg | DELAYED_RELEASE_TABLET | Freq: Two times a day (BID) | ORAL | 0 refills | Status: DC | PRN
  Filled 2022-09-11 – 2022-09-12 (×2): qty 60, 30d supply, fill #0

## 2022-09-12 ENCOUNTER — Other Ambulatory Visit (HOSPITAL_COMMUNITY): Payer: Self-pay

## 2022-09-16 ENCOUNTER — Other Ambulatory Visit (HOSPITAL_COMMUNITY): Payer: Self-pay

## 2022-10-22 ENCOUNTER — Other Ambulatory Visit (HOSPITAL_COMMUNITY): Payer: Self-pay

## 2022-10-22 ENCOUNTER — Other Ambulatory Visit: Payer: Self-pay

## 2022-11-07 ENCOUNTER — Other Ambulatory Visit (HOSPITAL_COMMUNITY): Payer: Self-pay

## 2022-11-07 MED ORDER — DOXYCYCLINE HYCLATE 100 MG PO TABS
100.0000 mg | ORAL_TABLET | Freq: Every day | ORAL | 2 refills | Status: DC
  Filled 2022-11-07: qty 30, 30d supply, fill #0

## 2022-11-08 ENCOUNTER — Other Ambulatory Visit (HOSPITAL_COMMUNITY): Payer: Self-pay

## 2022-12-31 ENCOUNTER — Other Ambulatory Visit (HOSPITAL_COMMUNITY): Payer: Self-pay

## 2023-01-01 ENCOUNTER — Other Ambulatory Visit (HOSPITAL_COMMUNITY): Payer: Self-pay

## 2023-01-01 ENCOUNTER — Other Ambulatory Visit: Payer: Self-pay

## 2023-01-28 ENCOUNTER — Other Ambulatory Visit (HOSPITAL_COMMUNITY): Payer: Self-pay

## 2023-02-21 ENCOUNTER — Other Ambulatory Visit (HOSPITAL_COMMUNITY): Payer: Self-pay

## 2023-02-26 ENCOUNTER — Ambulatory Visit: Payer: Medicare PPO | Admitting: Nurse Practitioner

## 2023-02-27 ENCOUNTER — Ambulatory Visit: Payer: Medicare PPO | Admitting: Nurse Practitioner

## 2023-03-13 ENCOUNTER — Ambulatory Visit (INDEPENDENT_AMBULATORY_CARE_PROVIDER_SITE_OTHER): Payer: Medicare PPO | Admitting: Nurse Practitioner

## 2023-03-13 ENCOUNTER — Other Ambulatory Visit: Payer: Self-pay | Admitting: Nurse Practitioner

## 2023-03-13 ENCOUNTER — Encounter: Payer: Self-pay | Admitting: Nurse Practitioner

## 2023-03-13 ENCOUNTER — Other Ambulatory Visit (HOSPITAL_COMMUNITY): Payer: Self-pay

## 2023-03-13 VITALS — BP 142/94 | HR 81 | Temp 98.2°F | Ht 70.0 in | Wt 192.0 lb

## 2023-03-13 DIAGNOSIS — M109 Gout, unspecified: Secondary | ICD-10-CM

## 2023-03-13 DIAGNOSIS — E663 Overweight: Secondary | ICD-10-CM

## 2023-03-13 DIAGNOSIS — I4891 Unspecified atrial fibrillation: Secondary | ICD-10-CM

## 2023-03-13 DIAGNOSIS — L821 Other seborrheic keratosis: Secondary | ICD-10-CM

## 2023-03-13 DIAGNOSIS — I499 Cardiac arrhythmia, unspecified: Secondary | ICD-10-CM | POA: Insufficient documentation

## 2023-03-13 DIAGNOSIS — Z6827 Body mass index (BMI) 27.0-27.9, adult: Secondary | ICD-10-CM

## 2023-03-13 HISTORY — DX: Unspecified atrial fibrillation: I48.91

## 2023-03-13 LAB — LIPID PANEL
Cholesterol: 193 mg/dL (ref 0–200)
HDL: 75.6 mg/dL (ref >39.00)
LDL Cholesterol: 88 mg/dL (ref 0–99)
NonHDL: 117.09
Total CHOL/HDL Ratio: 3
Triglycerides: 146 mg/dL (ref 0.0–149.0)
VLDL: 29.2 mg/dL (ref 0.0–40.0)

## 2023-03-13 LAB — COMPREHENSIVE METABOLIC PANEL
ALT: 13 U/L (ref 0–53)
AST: 21 U/L (ref 0–37)
Albumin: 4.4 g/dL (ref 3.5–5.2)
Alkaline Phosphatase: 64 U/L (ref 39–117)
BUN: 16 mg/dL (ref 6–23)
CO2: 27 mEq/L (ref 19–32)
Calcium: 9.3 mg/dL (ref 8.4–10.5)
Chloride: 103 mEq/L (ref 96–112)
Creatinine, Ser: 1.22 mg/dL (ref 0.40–1.50)
GFR: 57.91 mL/min — ABNORMAL LOW (ref >60.00)
Glucose, Bld: 86 mg/dL (ref 70–99)
Potassium: 5.1 mEq/L (ref 3.5–5.1)
Sodium: 139 mEq/L (ref 135–145)
Total Bilirubin: 0.9 mg/dL (ref 0.2–1.2)
Total Protein: 7.5 g/dL (ref 6.0–8.3)

## 2023-03-13 LAB — CBC
HCT: 48.1 % (ref 39.0–52.0)
Hemoglobin: 16.2 g/dL (ref 13.0–17.0)
MCHC: 33.7 g/dL (ref 30.0–36.0)
MCV: 100.9 fl — ABNORMAL HIGH (ref 78.0–100.0)
Platelets: 183 10*3/uL (ref 150.0–400.0)
RBC: 4.77 Mil/uL (ref 4.22–5.81)
RDW: 13.5 % (ref 11.5–15.5)
WBC: 9.3 10*3/uL (ref 4.0–10.5)

## 2023-03-13 LAB — TSH: TSH: 5.97 u[IU]/mL — ABNORMAL HIGH (ref 0.35–5.50)

## 2023-03-13 LAB — HEMOGLOBIN A1C: Hgb A1c MFr Bld: 5.3 % (ref 4.6–6.5)

## 2023-03-13 MED ORDER — APIXABAN 5 MG PO TABS
5.0000 mg | ORAL_TABLET | Freq: Two times a day (BID) | ORAL | 1 refills | Status: DC
Start: 2023-03-13 — End: 2023-08-26
  Filled 2023-03-13 – 2023-05-08 (×2): qty 60, 30d supply, fill #0
  Filled 2023-08-04: qty 60, 30d supply, fill #1

## 2023-03-13 NOTE — Assessment & Plan Note (Signed)
Chronic, stable Continue to follow-up with Dr. Dierdre Forth as scheduled

## 2023-03-13 NOTE — Assessment & Plan Note (Signed)
New diagnosis, Currently rate controlled at 81 despite not being on rhythm control medication ChadVasc scorer 2-3, as patient has no history of hypertension however blood pressure is hypertensive today. No recent kidney function testing on file, will get stat CMP.  Pending results will most likely initiate him on Eliquis.  We did discuss risks and benefits of blood thinner and the role they play in treatment of atrial fibrillation.  He reports his understanding. Urgent referral to A-fib clinic ordered today as well.

## 2023-03-13 NOTE — Progress Notes (Signed)
New Patient Office Visit  Subjective    Patient ID: Jose Robles, male    DOB: Feb 19, 1946  Age: 77 y.o. MRN: [id number]  CC:  Chief Complaint  Patient presents with   Gout    HPI Jose Robles presents to establish care He was referred to this office by his wife also comes here.  His main health care has been maintained by his rheumatologist Dr. Dierdre Forth who he sees for gout.  Reports he has not had a gout flare in approximately 10 years. He has noted that blood pressure has been labile over the last month or so.  Denies any chest pain, exertional fatigue, shortness of breath.  Not currently on an antihypertensive, also denies excessive salt intake. Has support keratosis on his back which causes discomfort in the form of itching and he would like to be seen by dermatology to determine whether any of the lesions can be removed.     Outpatient Encounter Medications as of 03/13/2023  Medication Sig   allopurinol (ZYLOPRIM) 100 MG tablet Take 2 tablets (200 mg total) by mouth daily.   fluocinonide cream (LIDEX) 0.05 % Apply topically 2 (two) times daily to affected area as directed   fluorouracil (EFUDEX) 5 % cream Apply topically 2 (two) times daily as directed   sildenafil (REVATIO) 20 MG tablet Take 4 tablets (80 mg total) by mouth daily as needed as directed   [DISCONTINUED] COVID-19 mRNA vaccine, Moderna, 100 MCG/0.5ML injection Inject into the muscle.   aspirin EC 325 MG EC tablet Take 1 tablet (325 mg total) by mouth 2 (two) times daily after a meal.   oxyCODONE-acetaminophen (ROXICET) 5-325 MG per tablet Take 1-2 tablets by mouth every 4 (four) hours as needed.   sildenafil (REVATIO) 20 MG tablet Take 2-3 tablets (40-60 mg total) by mouth as needed & as directed   [DISCONTINUED] allopurinol (ZYLOPRIM) 100 MG tablet TAKE 2 TABLETS BY MOUTH ONCE A DAY   [DISCONTINUED] calcipotriene (DOVONOX) 0.005 % cream Apply 1 Application topically under Efudex (fluorouracil)cream 2 (two) times  daily. (Patient not taking: Reported on 03/13/2023)   [DISCONTINUED] diclofenac (VOLTAREN) 75 MG EC tablet Take 1 tablet (75 mg total) by mouth 2 (two) times daily between meals as needed. (Patient not taking: Reported on 03/13/2023)   [DISCONTINUED] doxycycline (VIBRA-TABS) 100 MG tablet Take 1 tablet (100 mg total) by mouth daily. (Patient not taking: Reported on 03/13/2023)   [DISCONTINUED] methocarbamol (ROBAXIN) 500 MG tablet Take 1 tablet (500 mg total) by mouth every 8 (eight) hours as needed for muscle spasms. (Patient not taking: Reported on 03/13/2023)   [DISCONTINUED] methylPREDNISolone (MEDROL) 4 MG tablet Medrol dose pack. Take as instructed (Patient not taking: Reported on 03/13/2023)   No facility-administered encounter medications on file as of 03/13/2023.    Past Medical History:  Diagnosis Date   Arthritis    Cancer (HCC)    hx skin cancer   Gout    History of skin cancer    Nocturnal leg cramps    early in am- different parts of legs at different times. Relieved by mustard and ambulation    Past Surgical History:  Procedure Laterality Date   ANKLE ARTHROTOMY  10/08/1991   BILATERAL ANTERIOR TOTAL HIP ARTHROPLASTY Bilateral 09/02/2014   Procedure: BILATERAL ANTERIOR TOTAL HIP ARTHROPLASTY;  Surgeon: Kathryne Hitch, MD;  Location: [facility];  Service: Orthopedics;  Laterality: Bilateral;   JOINT REPLACEMENT     KNEE ARTHROSCOPY  10/08/1991    History reviewed. No  pertinent family history.  Social History   Socioeconomic History   Marital status: Married    Spouse name: Not on file   Number of children: Not on file   Years of education: Not on file   Highest education level: Not on file  Occupational History   Not on file  Tobacco Use   Smoking status: Former    Types: Cigarettes    Quit date: 08/29/2009    Years since quitting: 13.5   Smokeless tobacco: Not on file  Substance and Sexual Activity   Alcohol use: Yes    Alcohol/week: 2.0 standard drinks of  alcohol    Types: 2 Glasses of wine per week    Comment: 6 times /wk   Drug use: No   Sexual activity: Not on file  Other Topics Concern   Not on file  Social History Narrative   Not on file   Social Determinants of Health   Financial Resource Strain: Not on file  Food Insecurity: Not on file  Transportation Needs: Not on file  Physical Activity: Not on file  Stress: Not on file  Social Connections: Not on file  Intimate Partner Violence: Not on file    Review of Systems  Respiratory:  Negative for cough, shortness of breath and wheezing.   Cardiovascular:  Negative for chest pain and palpitations.  Gastrointestinal:  Negative for abdominal pain and blood in stool.  Genitourinary:  Negative for hematuria.  Psychiatric/Behavioral:  Negative for depression and suicidal ideas. The patient is not nervous/anxious.         Objective    BP (!) 142/94 (BP Location: Right Arm, Patient Position: Sitting, Cuff Size: Normal)   Pulse 81   Temp 98.2 F (36.8 C) (Oral)   Ht 5\' 10"  (1.778 m)   Wt 192 lb (87.1 kg)   SpO2 97%   BMI 27.55 kg/m      03/13/2023    2:37 PM  PHQ9 SCORE ONLY  PHQ-9 Total Score 0     Physical Exam Vitals reviewed.  Constitutional:      Appearance: Normal appearance.  HENT:     Head: Normocephalic and atraumatic.  Cardiovascular:     Rate and Rhythm: Normal rate. Rhythm regularly irregular.  Pulmonary:     Effort: Pulmonary effort is normal.     Breath sounds: Normal breath sounds.  Musculoskeletal:     Cervical back: Neck supple.  Skin:    General: Skin is warm and dry.       Neurological:     Mental Status: He is alert and oriented to person, place, and time.  Psychiatric:        Mood and Affect: Mood normal.        Behavior: Behavior normal.        Thought Content: Thought content normal.        Judgment: Judgment normal.   EKG: Atrial fibrillation with nonspecific intraventricular block, heart rate of 81      Assessment &  Plan:   Problem List Items Addressed This Visit       Cardiovascular and Mediastinum   New onset atrial fibrillation (HCC)    New diagnosis, Currently rate controlled at 81 despite not being on rhythm control medication ChadVasc scorer 2-3, as patient has no history of hypertension however blood pressure is hypertensive today. No recent kidney function testing on file, will get stat CMP.  Pending results will most likely initiate him on Eliquis.  We did discuss risks  and benefits of blood thinner and the role they play in treatment of atrial fibrillation.  He reports his understanding. Urgent referral to A-fib clinic ordered today as well.      Relevant Orders   EKG 12-Lead   Amb Referral to AFIB Clinic     Musculoskeletal and Integument   Seborrheic keratosis    Chronic Patient would like to discuss possibly having these removed by dermatology.  Referral to dermatology made today.      Relevant Orders   Ambulatory referral to Dermatology     Other   Gout    Chronic, stable Continue to follow-up with Dr. Dierdre Forth as scheduled      Overweight - Primary    Chronic Labs ordered, further recommendations may be made based upon his results       Relevant Orders   CBC   Hemoglobin A1c   Lipid panel   TSH   Comprehensive metabolic panel    Return in about 1 month (around 04/12/2023) for F/U with Patton Rabinovich allow 40 minutes. In addition to time spent on EKG I spent 58 minutes on this encounter including face-to-face evaluation, and development/discussion of treatment plan.  Elenore Paddy, NP

## 2023-03-13 NOTE — Assessment & Plan Note (Signed)
Chronic Labs ordered, further recommendations may be made based upon his results. 

## 2023-03-13 NOTE — Assessment & Plan Note (Signed)
Chronic Patient would like to discuss possibly having these removed by dermatology.  Referral to dermatology made today.

## 2023-03-14 ENCOUNTER — Telehealth: Payer: Medicare PPO | Admitting: Nurse Practitioner

## 2023-03-14 ENCOUNTER — Other Ambulatory Visit (HOSPITAL_COMMUNITY): Payer: Self-pay

## 2023-03-17 ENCOUNTER — Other Ambulatory Visit (HOSPITAL_COMMUNITY): Payer: Self-pay

## 2023-03-17 MED ORDER — IMIQUIMOD 5 % EX CREA
1.0000 | TOPICAL_CREAM | Freq: Every day | CUTANEOUS | 0 refills | Status: DC
  Filled 2023-03-17: qty 24, 84d supply, fill #0

## 2023-03-18 ENCOUNTER — Other Ambulatory Visit (HOSPITAL_COMMUNITY): Payer: Self-pay

## 2023-03-25 ENCOUNTER — Other Ambulatory Visit (HOSPITAL_COMMUNITY): Payer: Self-pay

## 2023-03-25 MED ORDER — ALLOPURINOL 100 MG PO TABS
150.0000 mg | ORAL_TABLET | Freq: Every day | ORAL | 3 refills | Status: DC
  Filled 2023-03-25 – 2023-04-08 (×2): qty 135, 90d supply, fill #0
  Filled 2023-08-04: qty 135, 90d supply, fill #1

## 2023-04-09 ENCOUNTER — Other Ambulatory Visit (HOSPITAL_COMMUNITY): Payer: Self-pay

## 2023-04-11 ENCOUNTER — Other Ambulatory Visit (HOSPITAL_COMMUNITY): Payer: Self-pay

## 2023-04-17 ENCOUNTER — Ambulatory Visit: Payer: Medicare PPO | Admitting: Nurse Practitioner

## 2023-05-08 ENCOUNTER — Other Ambulatory Visit (HOSPITAL_COMMUNITY): Payer: Self-pay

## 2023-06-06 ENCOUNTER — Other Ambulatory Visit (HOSPITAL_COMMUNITY): Payer: Self-pay

## 2023-06-12 DIAGNOSIS — H35371 Puckering of macula, right eye: Secondary | ICD-10-CM | POA: Diagnosis not present

## 2023-06-12 DIAGNOSIS — H2513 Age-related nuclear cataract, bilateral: Secondary | ICD-10-CM | POA: Diagnosis not present

## 2023-06-12 DIAGNOSIS — H17822 Peripheral opacity of cornea, left eye: Secondary | ICD-10-CM | POA: Diagnosis not present

## 2023-06-12 DIAGNOSIS — D3132 Benign neoplasm of left choroid: Secondary | ICD-10-CM | POA: Diagnosis not present

## 2023-06-12 DIAGNOSIS — H04123 Dry eye syndrome of bilateral lacrimal glands: Secondary | ICD-10-CM | POA: Diagnosis not present

## 2023-06-30 ENCOUNTER — Encounter (INDEPENDENT_AMBULATORY_CARE_PROVIDER_SITE_OTHER): Payer: Medicare Other | Admitting: Ophthalmology

## 2023-07-15 ENCOUNTER — Other Ambulatory Visit: Payer: Self-pay | Admitting: Nurse Practitioner

## 2023-07-15 ENCOUNTER — Other Ambulatory Visit (HOSPITAL_COMMUNITY): Payer: Self-pay

## 2023-07-17 ENCOUNTER — Other Ambulatory Visit: Payer: Self-pay

## 2023-07-17 ENCOUNTER — Encounter (HOSPITAL_COMMUNITY): Payer: Self-pay

## 2023-07-17 ENCOUNTER — Other Ambulatory Visit (HOSPITAL_COMMUNITY): Payer: Self-pay

## 2023-07-18 ENCOUNTER — Other Ambulatory Visit (HOSPITAL_COMMUNITY): Payer: Self-pay

## 2023-07-23 ENCOUNTER — Other Ambulatory Visit: Payer: Self-pay | Admitting: Nurse Practitioner

## 2023-07-23 ENCOUNTER — Other Ambulatory Visit (HOSPITAL_COMMUNITY): Payer: Self-pay

## 2023-07-23 MED ORDER — SILDENAFIL CITRATE 20 MG PO TABS
80.0000 mg | ORAL_TABLET | Freq: Every day | ORAL | 99 refills | Status: AC | PRN
Start: 2023-07-23 — End: ?
  Filled 2023-07-23: qty 120, 30d supply, fill #0
  Filled 2023-08-26 – 2023-10-24 (×2): qty 120, 30d supply, fill #1
  Filled 2024-03-12: qty 120, 30d supply, fill #2
  Filled 2024-05-07: qty 120, 30d supply, fill #3
  Filled 2024-05-24 – 2024-05-31 (×2): qty 120, 30d supply, fill #4
  Filled 2024-06-29: qty 120, 30d supply, fill #5

## 2023-07-24 ENCOUNTER — Other Ambulatory Visit (HOSPITAL_COMMUNITY): Payer: Self-pay

## 2023-07-25 ENCOUNTER — Other Ambulatory Visit (HOSPITAL_COMMUNITY): Payer: Self-pay

## 2023-07-28 ENCOUNTER — Other Ambulatory Visit (HOSPITAL_COMMUNITY): Payer: Self-pay

## 2023-07-29 ENCOUNTER — Other Ambulatory Visit (HOSPITAL_COMMUNITY): Payer: Self-pay

## 2023-07-29 MED ORDER — SILDENAFIL CITRATE 20 MG PO TABS
80.0000 mg | ORAL_TABLET | ORAL | 99 refills | Status: DC | PRN
  Filled 2023-07-29 – 2023-08-26 (×3): qty 120, 30d supply, fill #0
  Filled 2023-10-24 – 2023-12-04 (×2): qty 120, 30d supply, fill #1
  Filled 2024-01-02: qty 120, 30d supply, fill #2
  Filled 2024-02-18: qty 120, 30d supply, fill #3

## 2023-08-04 ENCOUNTER — Other Ambulatory Visit (HOSPITAL_COMMUNITY): Payer: Self-pay

## 2023-08-20 DIAGNOSIS — M1A09X0 Idiopathic chronic gout, multiple sites, without tophus (tophi): Secondary | ICD-10-CM | POA: Diagnosis not present

## 2023-08-26 ENCOUNTER — Other Ambulatory Visit (HOSPITAL_COMMUNITY): Payer: Self-pay

## 2023-08-26 ENCOUNTER — Other Ambulatory Visit: Payer: Self-pay | Admitting: Nurse Practitioner

## 2023-08-26 DIAGNOSIS — I499 Cardiac arrhythmia, unspecified: Secondary | ICD-10-CM | POA: Diagnosis not present

## 2023-08-26 DIAGNOSIS — M1991 Primary osteoarthritis, unspecified site: Secondary | ICD-10-CM | POA: Diagnosis not present

## 2023-08-26 DIAGNOSIS — M1A09X0 Idiopathic chronic gout, multiple sites, without tophus (tophi): Secondary | ICD-10-CM | POA: Diagnosis not present

## 2023-08-26 DIAGNOSIS — E663 Overweight: Secondary | ICD-10-CM | POA: Diagnosis not present

## 2023-08-26 DIAGNOSIS — R252 Cramp and spasm: Secondary | ICD-10-CM | POA: Diagnosis not present

## 2023-08-26 DIAGNOSIS — R7989 Other specified abnormal findings of blood chemistry: Secondary | ICD-10-CM | POA: Diagnosis not present

## 2023-08-26 DIAGNOSIS — I4891 Unspecified atrial fibrillation: Secondary | ICD-10-CM

## 2023-08-26 DIAGNOSIS — Z6828 Body mass index (BMI) 28.0-28.9, adult: Secondary | ICD-10-CM | POA: Diagnosis not present

## 2023-08-26 MED ORDER — ALLOPURINOL 100 MG PO TABS
150.0000 mg | ORAL_TABLET | Freq: Every day | ORAL | 3 refills | Status: AC
  Filled 2023-08-26 – 2023-12-17 (×3): qty 135, 90d supply, fill #0
  Filled 2024-06-30: qty 135, 90d supply, fill #1

## 2023-08-27 ENCOUNTER — Other Ambulatory Visit: Payer: Self-pay

## 2023-08-27 ENCOUNTER — Other Ambulatory Visit (HOSPITAL_COMMUNITY): Payer: Self-pay

## 2023-08-27 MED ORDER — APIXABAN 5 MG PO TABS
5.0000 mg | ORAL_TABLET | Freq: Two times a day (BID) | ORAL | 1 refills | Status: DC
Start: 2023-08-27 — End: 2023-10-27
  Filled 2023-08-27: qty 60, 30d supply, fill #0
  Filled 2023-10-02: qty 60, 30d supply, fill #1

## 2023-09-18 ENCOUNTER — Ambulatory Visit (INDEPENDENT_AMBULATORY_CARE_PROVIDER_SITE_OTHER): Payer: Medicare PPO | Admitting: Nurse Practitioner

## 2023-09-18 ENCOUNTER — Other Ambulatory Visit (HOSPITAL_COMMUNITY): Payer: Self-pay

## 2023-09-18 ENCOUNTER — Encounter: Payer: Self-pay | Admitting: Nurse Practitioner

## 2023-09-18 VITALS — BP 124/70 | HR 64 | Temp 97.8°F | Ht 70.0 in | Wt 198.0 lb

## 2023-09-18 DIAGNOSIS — Z125 Encounter for screening for malignant neoplasm of prostate: Secondary | ICD-10-CM | POA: Diagnosis not present

## 2023-09-18 DIAGNOSIS — R6889 Other general symptoms and signs: Secondary | ICD-10-CM | POA: Diagnosis not present

## 2023-09-18 DIAGNOSIS — I4891 Unspecified atrial fibrillation: Secondary | ICD-10-CM

## 2023-09-18 DIAGNOSIS — G4700 Insomnia, unspecified: Secondary | ICD-10-CM

## 2023-09-18 LAB — CBC WITH DIFFERENTIAL/PLATELET
Basophils Absolute: 0 10*3/uL (ref 0.0–0.1)
Basophils Relative: 0.4 % (ref 0.0–3.0)
Eosinophils Absolute: 0.3 10*3/uL (ref 0.0–0.7)
Eosinophils Relative: 2.8 % (ref 0.0–5.0)
HCT: 47.8 % (ref 39.0–52.0)
Hemoglobin: 16.3 g/dL (ref 13.0–17.0)
Lymphocytes Relative: 18 % (ref 12.0–46.0)
Lymphs Abs: 1.7 10*3/uL (ref 0.7–4.0)
MCHC: 34 g/dL (ref 30.0–36.0)
MCV: 101.7 fL — ABNORMAL HIGH (ref 78.0–100.0)
Monocytes Absolute: 1.1 10*3/uL — ABNORMAL HIGH (ref 0.1–1.0)
Monocytes Relative: 11.5 % (ref 3.0–12.0)
Neutro Abs: 6.2 10*3/uL (ref 1.4–7.7)
Neutrophils Relative %: 67.3 % (ref 43.0–77.0)
Platelets: 162 10*3/uL (ref 150.0–400.0)
RBC: 4.7 Mil/uL (ref 4.22–5.81)
RDW: 13.2 % (ref 11.5–15.5)
WBC: 9.2 10*3/uL (ref 4.0–10.5)

## 2023-09-18 LAB — TSH: TSH: 4.49 u[IU]/mL (ref 0.35–5.50)

## 2023-09-18 LAB — T3, FREE: T3, Free: 4.1 pg/mL (ref 2.3–4.2)

## 2023-09-18 LAB — BASIC METABOLIC PANEL
BUN: 16 mg/dL (ref 6–23)
CO2: 27 meq/L (ref 19–32)
Calcium: 9 mg/dL (ref 8.4–10.5)
Chloride: 102 meq/L (ref 96–112)
Creatinine, Ser: 1.11 mg/dL (ref 0.40–1.50)
GFR: 64.63 mL/min (ref >60.00)
Glucose, Bld: 80 mg/dL (ref 70–99)
Potassium: 4.2 meq/L (ref 3.5–5.1)
Sodium: 139 meq/L (ref 135–145)

## 2023-09-18 LAB — T4, FREE: Free T4: 0.93 ng/dL (ref 0.60–1.60)

## 2023-09-18 LAB — PSA: PSA: 3.29 ng/mL (ref 0.10–4.00)

## 2023-09-18 MED ORDER — TRAZODONE HCL 50 MG PO TABS
25.0000 mg | ORAL_TABLET | Freq: Every evening | ORAL | 0 refills | Status: DC | PRN
  Filled 2023-09-18: qty 30, 30d supply, fill #0

## 2023-09-18 NOTE — Assessment & Plan Note (Signed)
Patient reports 1 episode of nocturia, denies any weakness in urinary stream or urinary dribbling.  Would recommend prostate cancer screening today, PSA ordered.  Further recommendations may be made based upon the results.

## 2023-09-18 NOTE — Progress Notes (Signed)
Established Patient Office Visit  Subjective   Patient ID: Jose Robles, male    DOB: 06-21-1946  Age: 77 y.o. MRN: [id number]  Chief Complaint  Patient presents with   Medical Management of Chronic Issues    Follow up on eliquis    Patient has today for follow-up.  He was originally seen by myself for initial visit at this office about 6 months ago.  At that time physical exam identified irregular heartbeat.  EKG was completed which identified atrial fibrillation, this was a new finding.  He was started on anticoagulation and recommended to follow-up with me in 2 to 4 weeks as well as referred to cardiology in the atrial fibrillation clinic.  Patient did not follow-up with either of the specialist or myself.  He arrives today telling me that he feels stable, his significant other accompanies him today.  Her main concern in regards to him is that he reports difficulty staying asleep at night.  He does report 1 episode of nocturia, no significant snoring, no difficult to control high blood pressure, no witnessed apneic episodes, no witnessed paroxysmal nocturnal dyspnea.  He reports that he sleeps for 2 to 3 hours, then has to wake up usually to use the bathroom and then has a hard time falling back to sleep.  He also denies chest pain, shortness of breath, and cardiac palpitations.  He does remain on Eliquis and reports taking this as prescribed.  Denies any evidence of bleeding such as blood in stool or coffee-ground emesis.       ROS: See HPI    Objective:     BP 124/70 (BP Location: Right Arm, Patient Position: Sitting, Cuff Size: Normal)   Pulse 64   Temp 97.8 F (36.6 C) (Oral)   Ht 5\' 10"  (1.778 m)   Wt 198 lb (89.8 kg)   SpO2 99%   BMI 28.41 kg/m    Physical Exam Vitals reviewed.  Constitutional:      Appearance: Normal appearance.  HENT:     Head: Normocephalic and atraumatic.  Cardiovascular:     Rate and Rhythm: Normal rate and regular rhythm.  Pulmonary:      Effort: Pulmonary effort is normal.     Breath sounds: Normal breath sounds.  Musculoskeletal:     Cervical back: Neck supple.  Skin:    General: Skin is warm and dry.  Neurological:     Mental Status: He is alert and oriented to person, place, and time.     Comments: Patient does exhibit some forgetfulness.  He reports that he does not remember undergoing EKG and does remember discussion about atrial fibrillation 6 months ago.  He does repeat questions, and does not seem to believe me when I discussed his arrhythmia as identified on EKG.    Psychiatric:        Mood and Affect: Mood normal.        Behavior: Behavior normal.        Thought Content: Thought content normal.        Judgment: Judgment normal.      Results for orders placed or performed in visit on 09/18/23  T4, free  Result Value Ref Range   Free T4 0.93 0.60 - 1.60 ng/dL  T3, free  Result Value Ref Range   T3, Free 4.1 2.3 - 4.2 pg/mL  TSH  Result Value Ref Range   TSH 4.49 0.35 - 5.50 uIU/mL  CBC with Differential/Platelet  Result Value Ref Range  WBC 9.2 4.0 - 10.5 K/uL   RBC 4.70 4.22 - 5.81 Mil/uL   Hemoglobin 16.3 13.0 - 17.0 g/dL   HCT 82.9 56.2 - 13.0 %   MCV 101.7 (H) 78.0 - 100.0 fl   MCHC 34.0 30.0 - 36.0 g/dL   RDW 86.5 78.4 - 69.6 %   Platelets 162.0 150.0 - 400.0 K/uL   Neutrophils Relative % 67.3 43.0 - 77.0 %   Lymphocytes Relative 18.0 12.0 - 46.0 %   Monocytes Relative 11.5 3.0 - 12.0 %   Eosinophils Relative 2.8 0.0 - 5.0 %   Basophils Relative 0.4 0.0 - 3.0 %   Neutro Abs 6.2 1.4 - 7.7 K/uL   Lymphs Abs 1.7 0.7 - 4.0 K/uL   Monocytes Absolute 1.1 (H) 0.1 - 1.0 K/uL   Eosinophils Absolute 0.3 0.0 - 0.7 K/uL   Basophils Absolute 0.0 0.0 - 0.1 K/uL  Basic metabolic panel  Result Value Ref Range   Sodium 139 135 - 145 mEq/L   Potassium 4.2 3.5 - 5.1 mEq/L   Chloride 102 96 - 112 mEq/L   CO2 27 19 - 32 mEq/L   Glucose, Bld 80 70 - 99 mg/dL   BUN 16 6 - 23 mg/dL   Creatinine, Ser  2.95 0.40 - 1.50 mg/dL   GFR 28.41 >32.44 mL/min   Calcium 9.0 8.4 - 10.5 mg/dL  PSA  Result Value Ref Range   PSA 3.29 0.10 - 4.00 ng/mL      The 10-year ASCVD risk score (Arnett DK, et al., 2019) is: 22%    Assessment & Plan:   Problem List Items Addressed This Visit       Cardiovascular and Mediastinum   Cardiac arrhythmia - Primary   Chronic EKG today identifies atrial flutter with variable AV block.  Rate is normal at 71.  Patient continues to be on Eliquis.  I did disclose EKG results with patient and recommend that he follow-up with cardiology.  Referral to cardiology made again today.  He reports his understanding. Labs also ordered today for further evaluation.  Further recommendations may be made based upon the results.      Relevant Orders   PSA (Completed)   Basic metabolic panel (Completed)   CBC with Differential/Platelet (Completed)   TSH (Completed)   T3, free (Completed)   T4, free (Completed)   EKG 12-Lead   Ambulatory referral to Cardiology     Other   Prostate cancer screening   Patient reports 1 episode of nocturia, denies any weakness in urinary stream or urinary dribbling.  Would recommend prostate cancer screening today, PSA ordered.  Further recommendations may be made based upon the results.      Relevant Orders   PSA (Completed)   Insomnia   Chronic No significant red flags indicating sleep apnea could be cause today.  Discussed with backup supervising physician, will trial low-dose trazodone to see if this helps him sleep.  We discussed potential side effects with emphasis on increased risk for falls and and the need to change positions slowly overnight if he has to go to the restroom.      Relevant Medications   traZODone (DESYREL) 50 MG tablet   Forgetfulness   Patient appears somewhat forgetful to me during exam.  Unfortunately 6 that not completed today.  Patient significant other denies concerns regarding patient's cognition or  forgetfulness.  I have recommended evaluation with neurology regarding his insomnia and what I perceived to be forgetfulness.  Referral  to neurology made today.      Relevant Orders   Ambulatory referral to Neurology    Return in about 3 months (around 12/17/2023) for F/U with Samar Venneman.   Elenore Paddy, NP

## 2023-09-18 NOTE — Assessment & Plan Note (Signed)
Chronic No significant red flags indicating sleep apnea could be cause today.  Discussed with backup supervising physician, will trial low-dose trazodone to see if this helps him sleep.  We discussed potential side effects with emphasis on increased risk for falls and and the need to change positions slowly overnight if he has to go to the restroom.

## 2023-09-18 NOTE — Assessment & Plan Note (Addendum)
Chronic EKG today identifies atrial flutter with variable AV block.  Rate is normal at 71.  Patient continues to be on Eliquis.  I did disclose EKG results with patient and recommend that he follow-up with cardiology.  Referral to cardiology made again today.  He reports his understanding. Labs also ordered today for further evaluation.  Further recommendations may be made based upon the results.

## 2023-09-18 NOTE — Assessment & Plan Note (Signed)
Patient appears somewhat forgetful to me during exam.  Unfortunately 6 that not completed today.  Patient significant other denies concerns regarding patient's cognition or forgetfulness.  I have recommended evaluation with neurology regarding his insomnia and what I perceived to be forgetfulness.  Referral to neurology made today.

## 2023-09-23 ENCOUNTER — Encounter: Payer: Self-pay | Admitting: Physician Assistant

## 2023-09-25 ENCOUNTER — Ambulatory Visit (INDEPENDENT_AMBULATORY_CARE_PROVIDER_SITE_OTHER): Payer: Medicare PPO | Admitting: Nurse Practitioner

## 2023-09-25 ENCOUNTER — Ambulatory Visit (INDEPENDENT_AMBULATORY_CARE_PROVIDER_SITE_OTHER): Payer: Medicare PPO

## 2023-09-25 VITALS — BP 120/84 | HR 100 | Temp 97.6°F | Ht 70.0 in | Wt 196.2 lb

## 2023-09-25 DIAGNOSIS — R0989 Other specified symptoms and signs involving the circulatory and respiratory systems: Secondary | ICD-10-CM

## 2023-09-25 DIAGNOSIS — R918 Other nonspecific abnormal finding of lung field: Secondary | ICD-10-CM | POA: Diagnosis not present

## 2023-09-25 LAB — POCT RAPID STREP A (OFFICE): Rapid Strep A Screen: NEGATIVE

## 2023-09-25 LAB — POCT INFLUENZA A/B
Influenza A, POC: NEGATIVE
Influenza B, POC: NEGATIVE

## 2023-09-25 LAB — POCT RESPIRATORY SYNCYTIAL VIRUS: RSV Rapid Ag: NEGATIVE

## 2023-09-25 LAB — POC COVID19 BINAXNOW: SARS Coronavirus 2 Ag: NEGATIVE

## 2023-09-25 NOTE — Assessment & Plan Note (Signed)
Acute, etiology unclear Symptoms started 2 days ago but have drastically improved and are almost completely resolved today.  EKG compared to previous EKG is stable other than slightly increased QTc interval which may be related to high dose of trazodone patient took accidentally last night.  For now recommend patient to not take more than 50 mg of trazodone at night as needed. Point-of-care RSV, COVID, flu, strep: Negative Will obtain chest x-ray for further evaluation, further recommendations may be made based upon the results. Patient encouraged to follow-up with cardiology as scheduled in February, and to go to the emergency department if symptoms recur between now and his evaluation with cardiology in case this is angina/ACS.  He reports his understanding. Could also consider trial of PPI or H2 blocker in case this is GERD.  For now check chest x-ray with further recommendations to be made following results.

## 2023-09-25 NOTE — Progress Notes (Signed)
Established Patient Office Visit  Subjective   Patient ID: Jose Robles, male    DOB: November 12, 1945  Age: 77 y.o. MRN: [id number]  Chief Complaint  Patient presents with   chest congestion  Estimated Creatinine Clearance: 63.6 mL/min (by C-G formula based on SCr of 1.11 mg/dL).    PMHx significant for atrial fibrillation/flutter (on xarelto), skin cancer, arthritis, gout.   Patient arrives today with 2-day history of chest congestion. He denies chills, fever, sputum production, PND, orthopnea, shortness of breath, chest pain and palpitations. He admits to intermittent dry cough. He reports his symptoms have almost completely resolved. He states he accidentally took 4 tablets of his trazodone last night and feels groggy today. He states he tried 1 tablet of trazodone last week and did not feel any sedating effects. No history of HLD, last LDL 88. Former smoker, quit 14 years ago.     ROS: see HPI    Objective:     BP 120/84   Pulse 100   Temp 97.6 F (36.4 C) (Temporal)   Ht 5\' 10"  (1.778 m)   Wt 196 lb 4 oz (89 kg)   SpO2 96%   BMI 28.16 kg/m    Physical Exam Vitals reviewed.  Constitutional:      Appearance: Normal appearance.  HENT:     Head: Normocephalic and atraumatic.  Cardiovascular:     Rate and Rhythm: Normal rate. Rhythm irregular.     Heart sounds: No murmur heard. Pulmonary:     Effort: Pulmonary effort is normal.     Breath sounds: Normal breath sounds.  Musculoskeletal:     Cervical back: Neck supple.     Right lower leg: No edema.     Left lower leg: No edema.  Skin:    General: Skin is warm and dry.  Neurological:     Mental Status: He is alert and oriented to person, place, and time.  Psychiatric:        Mood and Affect: Mood normal.        Behavior: Behavior normal.        Thought Content: Thought content normal.        Judgment: Judgment normal.    EKG: A flutter with variable AV block, right bundle branch block.  QRS: 128 ms, QTc: 465  ms.  No ST segment changes identified.  Results for orders placed or performed in visit on 09/25/23  POC COVID-19 BinaxNow  Result Value Ref Range   SARS Coronavirus 2 Ag Negative Negative  POCT rapid strep A  Result Value Ref Range   Rapid Strep A Screen Negative Negative  POCT Influenza A/B  Result Value Ref Range   Influenza A, POC Negative Negative   Influenza B, POC Negative Negative  POCT respiratory syncytial virus  Result Value Ref Range   RSV Rapid Ag negative       The 10-year ASCVD risk score (Arnett DK, et al., 2019) is: 20.9%    Assessment & Plan:   Problem List Items Addressed This Visit       Respiratory   Chest congestion - Primary   Acute, etiology unclear Symptoms started 2 days ago but have drastically improved and are almost completely resolved today.  EKG compared to previous EKG is stable other than slightly increased QTc interval which may be related to high dose of trazodone patient took accidentally last night.  For now recommend patient to not take more than 50 mg of trazodone at night as needed.  Point-of-care RSV, COVID, flu, strep: Negative Will obtain chest x-ray for further evaluation, further recommendations may be made based upon the results. Patient encouraged to follow-up with cardiology as scheduled in February, and to go to the emergency department if symptoms recur between now and his evaluation with cardiology in case this is angina/ACS.  He reports his understanding. Could also consider trial of PPI or H2 blocker in case this is GERD.  For now check chest x-ray with further recommendations to be made following results.      Relevant Orders   DG Chest 2 View   POC COVID-19 BinaxNow (Completed)   POCT rapid strep A (Completed)   POCT Influenza A/B (Completed)   POCT respiratory syncytial virus (Completed)    Return in about 3 months (around 12/24/2023) for F/U with Devynn Hessler.  In addition to time spent on EKG, 45 minutes was spent on this  encounter today including face-to-face evaluation, review of previous medical records, consultation with supervising physician, and development/discussion of treatment plan.   Elenore Paddy, NP

## 2023-09-26 ENCOUNTER — Other Ambulatory Visit: Payer: Self-pay | Admitting: Nurse Practitioner

## 2023-10-02 ENCOUNTER — Other Ambulatory Visit (HOSPITAL_COMMUNITY): Payer: Self-pay

## 2023-10-06 ENCOUNTER — Other Ambulatory Visit (HOSPITAL_COMMUNITY): Payer: Self-pay

## 2023-10-06 ENCOUNTER — Telehealth: Payer: Medicare PPO | Admitting: Physician Assistant

## 2023-10-06 DIAGNOSIS — U071 COVID-19: Secondary | ICD-10-CM

## 2023-10-06 MED ORDER — NIRMATRELVIR/RITONAVIR (PAXLOVID)TABLET
3.0000 | ORAL_TABLET | Freq: Two times a day (BID) | ORAL | 0 refills | Status: AC
  Filled 2023-10-06: qty 30, 5d supply, fill #0

## 2023-10-06 MED ORDER — BENZONATATE 100 MG PO CAPS
100.0000 mg | ORAL_CAPSULE | Freq: Three times a day (TID) | ORAL | 0 refills | Status: DC | PRN
  Filled 2023-10-06: qty 30, 5d supply, fill #0

## 2023-10-06 MED ORDER — PROMETHAZINE-DM 6.25-15 MG/5ML PO SYRP
5.0000 mL | ORAL_SOLUTION | Freq: Every day | ORAL | 0 refills | Status: AC
  Filled 2023-10-06: qty 118, 23d supply, fill #0

## 2023-10-06 NOTE — Progress Notes (Signed)
Virtual Visit Consent   Jose Robles, you are scheduled for a virtual visit with a [facility] provider today. Just as with appointments in the office, your consent must be obtained to participate. Your consent will be active for this visit and any virtual visit you may have with one of our providers in the next 365 days. If you have a MyChart account, a copy of this consent can be sent to you electronically.  As this is a virtual visit, video technology does not allow for your provider to perform a traditional examination. This may limit your provider's ability to fully assess your condition. If your provider identifies any concerns that need to be evaluated in person or the need to arrange testing (such as labs, EKG, etc.), we will make arrangements to do so. Although advances in technology are sophisticated, we cannot ensure that it will always work on either your end or our end. If the connection with a video visit is poor, the visit may have to be switched to a telephone visit. With either a video or telephone visit, we are not always able to ensure that we have a secure connection.  By engaging in this virtual visit, you consent to the provision of healthcare and authorize for your insurance to be billed (if applicable) for the services provided during this visit. Depending on your insurance coverage, you may receive a charge related to this service.  I need to obtain your verbal consent now. Are you willing to proceed with your visit today? Jose Robles has provided verbal consent on 10/06/2023 for a virtual visit (video or telephone). Margaretann Loveless, PA-C  Date: 10/06/2023 10:57 AM  Virtual Visit via Video Note   I, Margaretann Loveless, connected with  Jose Robles  ([id number], 05-Nov-1945) on 10/06/23 at 10:45 AM EST by a video-enabled telemedicine application and verified that I am speaking with the correct person using two identifiers.  Location: Patient: Virtual Visit Location  Patient: Home Provider: Virtual Visit Location Provider: Home Office   I discussed the limitations of evaluation and management by telemedicine and the availability of in person appointments. The patient expressed understanding and agreed to proceed.    History of Present Illness: Jose Robles is a 77 y.o. who identifies as a male who was assigned male at birth, and is being seen today for Covid 72.  HPI: URI  This is a new problem. Episode onset: Positive Covid 19 on at home test on Sunday; Symptoms started Saturday. The problem has been gradually worsening. Maximum temperature: subjective low grade. The fever has been present for 1 to 2 days. Associated symptoms include congestion, coughing, headaches and rhinorrhea (and post nasal drainage, no more than usual). Pertinent negatives include no diarrhea, ear pain, nausea, plugged ear sensation, sinus pain, sore throat, vomiting or wheezing. Associated symptoms comments: Body aches. Treatments tried: aspirin, sudogest, tylenol. The treatment provided no relief.  Was seen in person with PCP on 09/25/23 and diagnosed with Viral URI, had negative Covid, Flu, and strep testing.  CXR did show new scarring or possible effusion in the left lower lung.    Problems:  Patient Active Problem List   Diagnosis Date Noted   Chest congestion 09/25/2023   Prostate cancer screening 09/18/2023   Insomnia 09/18/2023   Forgetfulness 09/18/2023   Gout 03/13/2023   Overweight 03/13/2023   Cardiac arrhythmia 03/13/2023   Seborrheic keratosis 03/13/2023   Nuclear sclerotic cataract of both eyes 06/27/2022   Right epiretinal membrane 06/27/2022  Posterior vitreous detachment of both eyes 06/27/2022   Dupuytren's contracture 01/23/2017   Laceration of right thumb without foreign body with damage to nail 01/23/2017   Bilateral hip joint primary osteoarthritis 09/02/2014   Status post bilateral total hip replacement 09/02/2014    Allergies:  Allergies   Allergen Reactions   Adhesive [Tape] Rash   Doxycycline Rash   Penicillins Rash   Scopolamine Rash   Tetracyclines & Related Rash   Medications:  Current Outpatient Medications:    benzonatate (TESSALON) 100 MG capsule, Take 1-2 capsules (100-200 mg total) by mouth 3 (three) times daily as needed., Disp: 30 capsule, Rfl: 0   nirmatrelvir/ritonavir (PAXLOVID) 20 x 150 MG & 10 x 100MG  TABS, Take 3 tablets by mouth 2 (two) times daily for 5 days. (Take nirmatrelvir 150 mg two tablets twice daily for 5 days and ritonavir 100 mg one tablet twice daily for 5 days) Patient GFR is 64, Disp: 30 tablet, Rfl: 0   promethazine-dextromethorphan (PROMETHAZINE-DM) 6.25-15 MG/5ML syrup, Take 5 mLs by mouth at bedtime., Disp: 118 mL, Rfl: 0   allopurinol (ZYLOPRIM) 100 MG tablet, Take 1&1/2 tablets (150 mg total) by mouth daily., Disp: 135 tablet, Rfl: 3   allopurinol (ZYLOPRIM) 100 MG tablet, Take 1.5 tablets (150 mg total) by mouth daily., Disp: 135 tablet, Rfl: 3   apixaban (ELIQUIS) 5 MG TABS tablet, Take 1 tablet (5 mg total) by mouth 2 (two) times daily., Disp: 60 tablet, Rfl: 1   fluocinonide cream (LIDEX) 0.05 %, Apply topically 2 (two) times daily to affected area as directed, Disp: 60 g, Rfl: 0   fluorouracil (EFUDEX) 5 % cream, Apply topically 2 (two) times daily as directed, Disp: 40 g, Rfl: 0   imiquimod (ALDARA) 5 % cream, Apply 1 application to lesion every day for 12 weeks, Disp: 24 each, Rfl: 0   oxyCODONE-acetaminophen (ROXICET) 5-325 MG per tablet, Take 1-2 tablets by mouth every 4 (four) hours as needed., Disp: 60 tablet, Rfl: 0   sildenafil (REVATIO) 20 MG tablet, Take 2-3 tablets (40-60 mg total) by mouth as needed & as directed, Disp: 100 tablet, Rfl: 5   sildenafil (REVATIO) 20 MG tablet, Take 4 tablets (80 mg total) by mouth daily as needed as directed, Disp: 120 tablet, Rfl: PRN   sildenafil (REVATIO) 20 MG tablet, Take 4 tablets (80 mg total) by mouth once daily as needed as  directed., Disp: 120 tablet, Rfl: PRN   traZODone (DESYREL) 50 MG tablet, Take 1/2-1 tablet (25-50 mg total) by mouth at bedtime as needed for sleep., Disp: 30 tablet, Rfl: 0  Observations/Objective: Patient is well-developed, well-nourished in no acute distress.  Resting comfortably at home.  Head is normocephalic, atraumatic.  No labored breathing.  Speech is clear and coherent with logical content.  Patient is alert and oriented at baseline.    Assessment and Plan: 1. COVID-19 (Primary) - MyChart COVID-19 home monitoring program; Future - nirmatrelvir/ritonavir (PAXLOVID) 20 x 150 MG & 10 x 100MG  TABS; Take 3 tablets by mouth 2 (two) times daily for 5 days. (Take nirmatrelvir 150 mg two tablets twice daily for 5 days and ritonavir 100 mg one tablet twice daily for 5 days) Patient GFR is 64  Dispense: 30 tablet; Refill: 0 - benzonatate (TESSALON) 100 MG capsule; Take 1-2 capsules (100-200 mg total) by mouth 3 (three) times daily as needed.  Dispense: 30 capsule; Refill: 0 - promethazine-dextromethorphan (PROMETHAZINE-DM) 6.25-15 MG/5ML syrup; Take 5 mLs by mouth at bedtime.  Dispense: 118 mL;  Refill: 0  - Continue OTC symptomatic management of choice - Will send OTC vitamins and supplement information through AVS - Paxlovid prescribed - Promethazine DM for nighttime cough - Tessalon perles for daytime cough - Patient enrolled in MyChart symptom monitoring - Push fluids - Rest as needed - Discussed return precautions and when to seek in-person evaluation, sent via AVS as well   Follow Up Instructions: I discussed the assessment and treatment plan with the patient. The patient was provided an opportunity to ask questions and all were answered. The patient agreed with the plan and demonstrated an understanding of the instructions.  A copy of instructions were sent to the patient via MyChart unless otherwise noted below.    The patient was advised to call back or seek an in-person  evaluation if the symptoms worsen or if the condition fails to improve as anticipated.    Margaretann Loveless, PA-C

## 2023-10-06 NOTE — Patient Instructions (Signed)
Cristal Ford, thank you for joining Margaretann Loveless, PA-C for today's virtual visit.  While this provider is not your primary care provider (PCP), if your PCP is located in our provider database this encounter information will be shared with them immediately following your visit.   A [facility] MyChart account gives you access to today's visit and all your visits, tests, and labs performed at [facility] " click here if you don't have a [facility] MyChart account or go to [email or web address]  Consent: (Patient) Jose Robles provided verbal consent for this virtual visit at the beginning of the encounter.  Current Medications:  Current Outpatient Medications:    benzonatate (TESSALON) 100 MG capsule, Take 1-2 capsules (100-200 mg total) by mouth 3 (three) times daily as needed., Disp: 30 capsule, Rfl: 0   nirmatrelvir/ritonavir (PAXLOVID) 20 x 150 MG & 10 x 100MG  TABS, Take 3 tablets by mouth 2 (two) times daily for 5 days. (Take nirmatrelvir 150 mg two tablets twice daily for 5 days and ritonavir 100 mg one tablet twice daily for 5 days) Patient GFR is 64, Disp: 30 tablet, Rfl: 0   promethazine-dextromethorphan (PROMETHAZINE-DM) 6.25-15 MG/5ML syrup, Take 5 mLs by mouth at bedtime., Disp: 118 mL, Rfl: 0   allopurinol (ZYLOPRIM) 100 MG tablet, Take 1&1/2 tablets (150 mg total) by mouth daily., Disp: 135 tablet, Rfl: 3   allopurinol (ZYLOPRIM) 100 MG tablet, Take 1.5 tablets (150 mg total) by mouth daily., Disp: 135 tablet, Rfl: 3   apixaban (ELIQUIS) 5 MG TABS tablet, Take 1 tablet (5 mg total) by mouth 2 (two) times daily., Disp: 60 tablet, Rfl: 1   fluocinonide cream (LIDEX) 0.05 %, Apply topically 2 (two) times daily to affected area as directed, Disp: 60 g, Rfl: 0   fluorouracil (EFUDEX) 5 % cream, Apply topically 2 (two) times daily as directed, Disp: 40 g, Rfl: 0   imiquimod (ALDARA) 5 % cream, Apply 1 application to lesion every day for 12 weeks, Disp: 24 each,  Rfl: 0   oxyCODONE-acetaminophen (ROXICET) 5-325 MG per tablet, Take 1-2 tablets by mouth every 4 (four) hours as needed., Disp: 60 tablet, Rfl: 0   sildenafil (REVATIO) 20 MG tablet, Take 2-3 tablets (40-60 mg total) by mouth as needed & as directed, Disp: 100 tablet, Rfl: 5   sildenafil (REVATIO) 20 MG tablet, Take 4 tablets (80 mg total) by mouth daily as needed as directed, Disp: 120 tablet, Rfl: PRN   sildenafil (REVATIO) 20 MG tablet, Take 4 tablets (80 mg total) by mouth once daily as needed as directed., Disp: 120 tablet, Rfl: PRN   traZODone (DESYREL) 50 MG tablet, Take 1/2-1 tablet (25-50 mg total) by mouth at bedtime as needed for sleep., Disp: 30 tablet, Rfl: 0   Medications ordered in this encounter:  Meds ordered this encounter  Medications   nirmatrelvir/ritonavir (PAXLOVID) 20 x 150 MG & 10 x 100MG  TABS    Sig: Take 3 tablets by mouth 2 (two) times daily for 5 days. (Take nirmatrelvir 150 mg two tablets twice daily for 5 days and ritonavir 100 mg one tablet twice daily for 5 days) Patient GFR is 64    Dispense:  30 tablet    Refill:  0    Supervising Provider:   Merrilee Jansky [[id number]]   benzonatate (TESSALON) 100 MG capsule    Sig: Take 1-2 capsules (100-200 mg total) by mouth 3 (three) times daily as needed.    Dispense:  30 capsule  Refill:  0    Supervising Provider:   Merrilee Jansky [[id number]]   promethazine-dextromethorphan (PROMETHAZINE-DM) 6.25-15 MG/5ML syrup    Sig: Take 5 mLs by mouth at bedtime.    Dispense:  118 mL    Refill:  0    Supervising Provider:   Merrilee Jansky [[id number]]     *If you need refills on other medications prior to your next appointment, please contact your pharmacy*  Follow-Up: Call back or seek an in-person evaluation if the symptoms worsen or if the condition fails to improve as anticipated.  [facility] [phone]  Care Instructions: Can take to lessen severity: Vit C 500mg  twice daily Quercertin  250-500mg  twice daily Zinc 75-100mg  daily Melatonin 3-6 mg at bedtime Vit D3 1000-2000 IU daily Optional: Famotidine 20mg  daily Also can add tylenol/ibuprofen as needed for fevers and body aches May add Mucinex or Mucinex DM as needed for cough/congestion    Isolation Instructions: You are to isolate at home until you have been fever free for at least 24 hours without a fever-reducing medication, and symptoms have been steadily improving for 24 hours. At that time,  you can end isolation but need to mask for an additional 5 days.   If you must be around other household members who do not have symptoms, you need to make sure that both you and the family members are masking consistently with a high-quality mask.  If you note any worsening of symptoms despite treatment, please seek an in-person evaluation ASAP. If you note any significant shortness of breath or any chest pain, please seek ER evaluation. Please do not delay care!   COVID-19: What to Do if You Are Sick If you test positive and are an older adult or someone who is at high risk of getting very sick from COVID-19, treatment may be available. Contact a healthcare provider right away after a positive test to determine if you are eligible, even if your symptoms are mild right now. You can also visit a Test to Treat location and, if eligible, receive a prescription from a provider. Don't delay: Treatment must be started within the first few days to be effective. If you have a fever, cough, or other symptoms, you might have COVID-19. Most people have mild illness and are able to recover at home. If you are sick: Keep track of your symptoms. If you have an emergency warning sign (including trouble breathing), call 911. Steps to help prevent the spread of COVID-19 if you are sick If you are sick with COVID-19 or think you might have COVID-19, follow the steps below to care for yourself and to help protect other people in your home and  community. Stay home except to get medical care Stay home. Most people with COVID-19 have mild illness and can recover at home without medical care. Do not leave your home, except to get medical care. Do not visit public areas and do not go to places where you are unable to wear a mask. Take care of yourself. Get rest and stay hydrated. Take over-the-counter medicines, such as acetaminophen, to help you feel better. Stay in touch with your doctor. Call before you get medical care. Be sure to get care if you have trouble breathing, or have any other emergency warning signs, or if you think it is an emergency. Avoid public transportation, ride-sharing, or taxis if possible. Get tested If you have symptoms of COVID-19, get tested. While waiting for test results, stay  away from others, including staying apart from those living in your household. Get tested as soon as possible after your symptoms start. Treatments may be available for people with COVID-19 who are at risk for becoming very sick. Don't delay: Treatment must be started early to be effective--some treatments must begin within 5 days of your first symptoms. Contact your healthcare provider right away if your test result is positive to determine if you are eligible. Self-tests are one of several options for testing for the virus that causes COVID-19 and may be more convenient than laboratory-based tests and point-of-care tests. Ask your healthcare provider or your local health department if you need help interpreting your test results. You can visit your state, tribal, local, and territorial health department's website to look for the latest local information on testing sites. Separate yourself from other people As much as possible, stay in a specific room and away from other people and pets in your home. If possible, you should use a separate bathroom. If you need to be around other people or animals in or outside of the home, wear a well-fitting  mask. Tell your close contacts that they may have been exposed to COVID-19. An infected person can spread COVID-19 starting 48 hours (or 2 days) before the person has any symptoms or tests positive. By letting your close contacts know they may have been exposed to COVID-19, you are helping to protect everyone. See COVID-19 and Animals if you have questions about pets. If you are diagnosed with COVID-19, someone from the health department may call you. Answer the call to slow the spread. Monitor your symptoms Symptoms of COVID-19 include fever, cough, or other symptoms. Follow care instructions from your healthcare provider and local health department. Your local health authorities may give instructions on checking your symptoms and reporting information. When to seek emergency medical attention Look for emergency warning signs* for COVID-19. If someone is showing any of these signs, seek emergency medical care immediately: Trouble breathing Persistent pain or pressure in the chest New confusion Inability to wake or stay awake Pale, gray, or blue-colored skin, lips, or nail beds, depending on skin tone *This list is not all possible symptoms. Please call your medical provider for any other symptoms that are severe or concerning to you. Call 911 or call ahead to your local emergency facility: Notify the operator that you are seeking care for someone who has or may have COVID-19. Call ahead before visiting your doctor Call ahead. Many medical visits for routine care are being postponed or done by phone or telemedicine. If you have a medical appointment that cannot be postponed, call your doctor's office, and tell them you have or may have COVID-19. This will help the office protect themselves and other patients. If you are sick, wear a well-fitting mask You should wear a mask if you must be around other people or animals, including pets (even at home). Wear a mask with the best fit, protection, and  comfort for you. You don't need to wear the mask if you are alone. If you can't put on a mask (because of trouble breathing, for example), cover your coughs and sneezes in some other way. Try to stay at least 6 feet away from other people. This will help protect the people around you. Masks should not be placed on young children under age 51 years, anyone who has trouble breathing, or anyone who is not able to remove the mask without help. Cover your coughs and sneezes  Cover your mouth and nose with a tissue when you cough or sneeze. Throw away used tissues in a lined trash can. Immediately wash your hands with soap and water for at least 20 seconds. If soap and water are not available, clean your hands with an alcohol-based hand sanitizer that contains at least 60% alcohol. Clean your hands often Wash your hands often with soap and water for at least 20 seconds. This is especially important after blowing your nose, coughing, or sneezing; going to the bathroom; and before eating or preparing food. Use hand sanitizer if soap and water are not available. Use an alcohol-based hand sanitizer with at least 60% alcohol, covering all surfaces of your hands and rubbing them together until they feel dry. Soap and water are the best option, especially if hands are visibly dirty. Avoid touching your eyes, nose, and mouth with unwashed hands. Handwashing Tips Avoid sharing personal household items Do not share dishes, drinking glasses, cups, eating utensils, towels, or bedding with other people in your home. Wash these items thoroughly after using them with soap and water or put in the dishwasher. Clean surfaces in your home regularly Clean and disinfect high-touch surfaces (for example, doorknobs, tables, handles, light switches, and countertops) in your "sick room" and bathroom. In shared spaces, you should clean and disinfect surfaces and items after each use by the person who is ill. If you are sick and  cannot clean, a caregiver or other person should only clean and disinfect the area around you (such as your bedroom and bathroom) on an as needed basis. Your caregiver/other person should wait as long as possible (at least several hours) and wear a mask before entering, cleaning, and disinfecting shared spaces that you use. Clean and disinfect areas that may have blood, stool, or body fluids on them. Use household cleaners and disinfectants. Clean visible dirty surfaces with household cleaners containing soap or detergent. Then, use a household disinfectant. Use a product from [employer] List N: Disinfectants for Coronavirus (COVID-19). Be sure to follow the instructions on the label to ensure safe and effective use of the product. Many products recommend keeping the surface wet with a disinfectant for a certain period of time (look at "contact time" on the product label). You may also need to wear personal protective equipment, such as gloves, depending on the directions on the product label. Immediately after disinfecting, wash your hands with soap and water for 20 seconds. For completed guidance on cleaning and disinfecting your home, visit Complete Disinfection Guidance. Take steps to improve ventilation at home Improve ventilation (air flow) at home to help prevent from spreading COVID-19 to other people in your household. Clear out COVID-19 virus particles in the air by opening windows, using air filters, and turning on fans in your home. Use this interactive tool to learn how to improve air flow in your home. When you can be around others after being sick with COVID-19 Deciding when you can be around others is different for different situations. Find out when you can safely end home isolation. For any additional questions about your care, contact your healthcare provider or state or local health department. 12/26/2020 Content source: [facility]  ([facility]), Division of Viral Diseases This information is not intended to replace advice given to you by your health care provider. Make sure you discuss any questions you have with your health care provider. Document Revised: 02/08/2021 Document Reviewed: 02/08/2021 Elsevier Patient Education  2022 [employer].  If you have been instructed to have an in-person evaluation today at a local Urgent Care facility, please use the link below. It will take you to a list of all of our available [facility], including address, phone number and hours of operation. Please do not delay care.  [facility]  If you or a family member do not have a primary care provider, use the link below to schedule a visit and establish care. When you choose a [facility] primary care physician or advanced practice provider, you gain a long-term partner in health. Find a Primary Care Provider  Learn more about [facility] in-office and virtual care options:  - Get Care Now

## 2023-10-13 ENCOUNTER — Other Ambulatory Visit (HOSPITAL_COMMUNITY): Payer: Self-pay

## 2023-10-14 ENCOUNTER — Other Ambulatory Visit (HOSPITAL_COMMUNITY): Payer: Self-pay

## 2023-10-15 ENCOUNTER — Other Ambulatory Visit (HOSPITAL_COMMUNITY): Payer: Self-pay

## 2023-10-23 ENCOUNTER — Telehealth: Payer: Self-pay | Admitting: Nurse Practitioner

## 2023-10-23 DIAGNOSIS — R9389 Abnormal findings on diagnostic imaging of other specified body structures: Secondary | ICD-10-CM

## 2023-10-23 NOTE — Telephone Encounter (Signed)
Please call patient and let him know that it has been about 1 month since his last chest xray which identified scarring or possible fluid. It was recommended that he repeat chest xray in about 1 month. Thus, I will order repeat chest xray and he should come to the xray at his earliest convenience to have this completed.

## 2023-10-24 ENCOUNTER — Other Ambulatory Visit (HOSPITAL_COMMUNITY): Payer: Self-pay

## 2023-10-24 NOTE — Telephone Encounter (Addendum)
Called pt, spoke to wife and made her aware of xray order to get it done in about a month, wife stated they will be there to get it done

## 2023-10-27 ENCOUNTER — Other Ambulatory Visit: Payer: Self-pay | Admitting: Family

## 2023-10-27 ENCOUNTER — Ambulatory Visit: Payer: Medicare PPO | Admitting: Physician Assistant

## 2023-10-27 ENCOUNTER — Other Ambulatory Visit (HOSPITAL_COMMUNITY): Payer: Self-pay

## 2023-10-27 ENCOUNTER — Ambulatory Visit: Payer: Medicare PPO

## 2023-10-27 ENCOUNTER — Other Ambulatory Visit: Payer: Self-pay | Admitting: Nurse Practitioner

## 2023-10-27 DIAGNOSIS — I4891 Unspecified atrial fibrillation: Secondary | ICD-10-CM

## 2023-10-27 MED ORDER — APIXABAN 5 MG PO TABS
5.0000 mg | ORAL_TABLET | Freq: Two times a day (BID) | ORAL | 3 refills | Status: DC
  Filled 2023-10-27 – 2023-11-20 (×2): qty 60, 30d supply, fill #0
  Filled 2023-12-17: qty 60, 30d supply, fill #1
  Filled 2024-01-14 (×2): qty 60, 30d supply, fill #2
  Filled 2024-03-12: qty 60, 30d supply, fill #3

## 2023-10-29 DIAGNOSIS — B081 Molluscum contagiosum: Secondary | ICD-10-CM | POA: Diagnosis not present

## 2023-11-06 ENCOUNTER — Other Ambulatory Visit (HOSPITAL_COMMUNITY): Payer: Self-pay

## 2023-11-07 ENCOUNTER — Other Ambulatory Visit (HOSPITAL_COMMUNITY): Payer: Self-pay

## 2023-11-07 DIAGNOSIS — B081 Molluscum contagiosum: Secondary | ICD-10-CM | POA: Diagnosis not present

## 2023-11-07 DIAGNOSIS — H1132 Conjunctival hemorrhage, left eye: Secondary | ICD-10-CM | POA: Diagnosis not present

## 2023-11-07 MED ORDER — SULFAMETHOXAZOLE-TRIMETHOPRIM 800-160 MG PO TABS
1.0000 | ORAL_TABLET | Freq: Two times a day (BID) | ORAL | 0 refills | Status: DC
  Filled 2023-11-07: qty 14, 7d supply, fill #0

## 2023-11-11 ENCOUNTER — Other Ambulatory Visit: Payer: Medicare PPO

## 2023-11-11 ENCOUNTER — Ambulatory Visit: Payer: Medicare PPO | Admitting: Physician Assistant

## 2023-11-11 ENCOUNTER — Ambulatory Visit: Payer: Medicare PPO

## 2023-11-11 ENCOUNTER — Encounter: Payer: Self-pay | Admitting: Physician Assistant

## 2023-11-11 VITALS — BP 160/122 | HR 98 | Resp 20 | Ht 70.0 in | Wt 195.0 lb

## 2023-11-11 DIAGNOSIS — R413 Other amnesia: Secondary | ICD-10-CM

## 2023-11-11 NOTE — Patient Instructions (Addendum)
 It was a pleasure to see you today at our office.   Recommendations:  Neurocognitive evaluation at our office   MRI of the brain, the radiology office will call you to arrange you appointment  [phone] Check labs today  [address] Follow up in  3  months    For psychiatric meds, mood meds: Please have your primary care physician manage these medications.  If you have any severe symptoms of a stroke, or other severe issues such as confusion,severe chills or fever, etc call 911 or go to the ER as you may need to be evaluated further   For guidance regarding [facility] and if placement were needed at the facility, contact Social Worker tel: [phone]  For assessment of decision of mental capacity and competency:  Call Dr. Erick Blinks, geriatric psychiatrist at [phone]  Counseling regarding caregiver distress, including caregiver depression, anxiety and issues regarding community resources, adult day care programs, adult living facilities, or memory care questions:  please contact your  Primary Doctor's Social Worker   Whom to call: Memory  decline, memory medications: Call our office [phone]    [email or web address]   RECOMMENDATIONS FOR ALL PATIENTS WITH MEMORY PROBLEMS: 1. Continue to exercise (Recommend 30 minutes of walking everyday, or 3 hours every week) 2. Increase social interactions - continue going to [place] and enjoy social gatherings with friends and family 3. Eat healthy, avoid fried foods and eat more fruits and vegetables 4. Maintain adequate blood pressure, blood sugar, and blood cholesterol level. Reducing the risk of stroke and cardiovascular disease also helps promoting better memory. 5. Avoid stressful situations. Live a simple life and avoid aggravations. Organize your time and prepare for the next day in anticipation. 6. Sleep well, avoid any interruptions of sleep and avoid  any distractions in the bedroom that may interfere with adequate sleep quality 7. Avoid sugar, avoid sweets as there is a strong link between excessive sugar intake, diabetes, and cognitive impairment We discussed the Mediterranean diet, which has been shown to help patients reduce the risk of progressive memory disorders and reduces cardiovascular risk. This includes eating fish, eat fruits and green leafy vegetables, nuts like almonds and hazelnuts, walnuts, and also use olive oil. Avoid fast foods and fried foods as much as possible. Avoid sweets and sugar as sugar use has been linked to worsening of memory function.  There is always a concern of gradual progression of memory problems. If this is the case, then we may need to adjust level of care according to patient needs. Support, both to the patient and caregiver, should then be put into place.      You have been referred for a neuropsychological evaluation (i.e., evaluation of memory and thinking abilities). Please bring someone with you to this appointment if possible, as it is helpful for the doctor to hear from both you and another adult who knows you well. Please bring eyeglasses and hearing aids if you wear them.    The evaluation will take approximately 3 hours and has two parts:   The first part is a clinical interview with the neuropsychologist (Dr. Milbert Coulter or Dr. Roseanne Reno). During the interview, the neuropsychologist will speak with you and the individual you brought to the appointment.    The second part of the evaluation is testing with the doctor's technician Annabelle Harman or Selena Batten). During the testing, the technician will ask you to remember different types of material, solve problems, and answer some questionnaires. Your family member will  not be present for this portion of the evaluation.   Please note: We must reserve several hours of the neuropsychologist's time and the psychometrician's time for your evaluation appointment. As such, there  is a No-Show fee of $100. If you are unable to attend any of your appointments, please contact our office as soon as possible to reschedule.      DRIVING: Regarding driving, in patients with progressive memory problems, driving will be impaired. We advise to have someone else do the driving if trouble finding directions or if minor accidents are reported. Independent driving assessment is available to determine safety of driving.   If you are interested in the driving assessment, you can contact the following:  [employer] in [place] [phone]  [facility] [phone]  [facility] [phone]  [facility] [phone] or [phone]   FALL PRECAUTIONS: Be cautious when walking. Scan the area for obstacles that may increase the risk of trips and falls. When getting up in the mornings, sit up at the edge of the bed for a few minutes before getting out of bed. Consider elevating the bed at the head end to avoid drop of blood pressure when getting up. Walk always in a well-lit room (use night lights in the walls). Avoid area rugs or power cords from appliances in the middle of the walkways. Use a walker or a cane if necessary and consider physical therapy for balance exercise. Get your eyesight checked regularly.  FINANCIAL OVERSIGHT: Supervision, especially oversight when making financial decisions or transactions is also recommended.  HOME SAFETY: Consider the safety of the kitchen when operating appliances like stoves, microwave oven, and blender. Consider having supervision and share cooking responsibilities until no longer able to participate in those. Accidents with firearms and other hazards in the house should be identified and addressed as well.   ABILITY TO BE LEFT ALONE: If patient is unable to contact 911 operator, consider using LifeLine, or when the need is there, arrange for someone to stay with patients. Smoking is a fire  hazard, consider supervision or cessation. Risk of wandering should be assessed by caregiver and if detected at any point, supervision and safe proof recommendations should be instituted.  MEDICATION SUPERVISION: Inability to self-administer medication needs to be constantly addressed. Implement a mechanism to ensure safe administration of the medications.      Mediterranean Diet A Mediterranean diet refers to food and lifestyle choices that are based on the traditions of countries located on the [employer]. This way of eating has been shown to help prevent certain conditions and improve outcomes for people who have chronic diseases, like kidney disease and heart disease. What are tips for following this plan? Lifestyle  Cook and eat meals together with your family, when possible. Drink enough fluid to keep your urine clear or pale yellow. Be physically active every day. This includes: Aerobic exercise like running or swimming. Leisure activities like gardening, walking, or housework. Get 7-8 hours of sleep each night. If recommended by your health care provider, drink red wine in moderation. This means 1 glass a day for nonpregnant women and 2 glasses a day for men. A glass of wine equals 5 oz (150 mL). Reading food labels  Check the serving size of packaged foods. For foods such as rice and pasta, the serving size refers to the amount of cooked product, not dry. Check the total fat in packaged foods. Avoid foods that have saturated fat or trans fats. Check  the ingredients list for added sugars, such as corn syrup. Shopping  At the grocery store, buy most of your food from the areas near the walls of the store. This includes: Fresh fruits and vegetables (produce). Grains, beans, nuts, and seeds. Some of these may be available in unpackaged forms or large amounts (in bulk). Fresh seafood. Poultry and eggs. Low-fat dairy products. Buy whole ingredients instead of prepackaged  foods. Buy fresh fruits and vegetables in-season from local farmers markets. Buy frozen fruits and vegetables in resealable bags. If you do not have access to quality fresh seafood, buy precooked frozen shrimp or canned fish, such as tuna, salmon, or sardines. Buy small amounts of raw or cooked vegetables, salads, or olives from the deli or salad bar at your store. Stock your pantry so you always have certain foods on hand, such as olive oil, canned tuna, canned tomatoes, rice, pasta, and beans. Cooking  Cook foods with extra-virgin olive oil instead of using butter or other vegetable oils. Have meat as a side dish, and have vegetables or grains as your main dish. This means having meat in small portions or adding small amounts of meat to foods like pasta or stew. Use beans or vegetables instead of meat in common dishes like chili or lasagna. Experiment with different cooking methods. Try roasting or broiling vegetables instead of steaming or sauteing them. Add frozen vegetables to soups, stews, pasta, or rice. Add nuts or seeds for added healthy fat at each meal. You can add these to yogurt, salads, or vegetable dishes. Marinate fish or vegetables using olive oil, lemon juice, garlic, and fresh herbs. Meal planning  Plan to eat 1 vegetarian meal one day each week. Try to work up to 2 vegetarian meals, if possible. Eat seafood 2 or more times a week. Have healthy snacks readily available, such as: Vegetable sticks with hummus. Greek yogurt. Fruit and nut trail mix. Eat balanced meals throughout the week. This includes: Fruit: 2-3 servings a day Vegetables: 4-5 servings a day Low-fat dairy: 2 servings a day Fish, poultry, or lean meat: 1 serving a day Beans and legumes: 2 or more servings a week Nuts and seeds: 1-2 servings a day Whole grains: 6-8 servings a day Extra-virgin olive oil: 3-4 servings a day Limit red meat and sweets to only a few servings a month What are my food  choices? Mediterranean diet Recommended Grains: Whole-grain pasta. Brown rice. Bulgar wheat. Polenta. Couscous. Whole-wheat bread. Orpah Cobb. Vegetables: Artichokes. Beets. Broccoli. Cabbage. Carrots. Eggplant. Green beans. Chard. Kale. Spinach. Onions. Leeks. Peas. Squash. Tomatoes. Peppers. Radishes. Fruits: Apples. Apricots. Avocado. Berries. Bananas. Cherries. Dates. Figs. Grapes. Lemons. Melon. Oranges. Peaches. Plums. Pomegranate. Meats and other protein foods: Beans. Almonds. Sunflower seeds. Pine nuts. Peanuts. Cod. Salmon. Scallops. Shrimp. Tuna. Tilapia. Clams. Oysters. Eggs. Dairy: Low-fat milk. Cheese. Greek yogurt. Beverages: Water. Red wine. Herbal tea. Fats and oils: Extra virgin olive oil. Avocado oil. Grape seed oil. Sweets and desserts: [place] yogurt with honey. Baked apples. Poached pears. Trail mix. Seasoning and other foods: Basil. Cilantro. Coriander. Cumin. Mint. Parsley. Sage. Rosemary. Tarragon. Garlic. Oregano. Thyme. Pepper. Balsalmic vinegar. Tahini. Hummus. Tomato sauce. Olives. Mushrooms. Limit these Grains: Prepackaged pasta or rice dishes. Prepackaged cereal with added sugar. Vegetables: Deep fried potatoes (french fries). Fruits: Fruit canned in syrup. Meats and other protein foods: Beef. Pork. Lamb. Poultry with skin. Hot dogs. Tomasa Blase. Dairy: Ice cream. Sour cream. Whole milk. Beverages: Juice. Sugar-sweetened soft drinks. Beer. Liquor and spirits. Fats and oils: Butter.  Canola oil. Vegetable oil. Beef fat (tallow). Lard. Sweets and desserts: Cookies. Cakes. Pies. Candy. Seasoning and other foods: Mayonnaise. Premade sauces and marinades. The items listed may not be a complete list. Talk with your dietitian about what dietary choices are right for you. Summary The Mediterranean diet includes both food and lifestyle choices. Eat a variety of fresh fruits and vegetables, beans, nuts, seeds, and whole grains. Limit the amount of red meat and sweets that  you eat. Talk with your health care provider about whether it is safe for you to drink red wine in moderation. This means 1 glass a day for nonpregnant women and 2 glasses a day for men. A glass of wine equals 5 oz (150 mL). This information is not intended to replace advice given to you by your health care provider. Make sure you discuss any questions you have with your health care provider. Document Released: 05/16/2016 Document Revised: 06/18/2016 Document Reviewed: 05/16/2016 Elsevier Interactive Patient Education  2017 [employer].

## 2023-11-11 NOTE — Progress Notes (Signed)
 Assessment/Plan:   Jose Robles is a very pleasant 78 y.o. year old RH male with a history of hypertension, hyperlipidemia, HOH, new onset of atrial fibrillation, seen today for evaluation of memory loss. MoCA today is 21/30.  Etiology of his memory loss is unclear although likely multifactorial.  He continues to drink alcohol, and he was counseled to contribution of alcohol to memory.  In addition, the patient has a history of insomnia, which is in the process of being evaluated.  His hearing is very poor, and we discussed the importance of good hearing in order to improve comprehension, and its role on memory.  Workup is in progress.  Patient is able to participate on his IADLs and continues to drive.  Memory Impairment of unclear etiology  MRI brain without contrast to assess for underlying structural abnormality and assess vascular load  Neurocognitive testing to further evaluate cognitive concerns and determine other underlying cause of memory changes, including potential contribution from sleep, anxiety, attention, or depression  Check B12, B1 Continue to control mood as per PCP Recommend good control of cardiovascular risk factors, follow-up with cardiology.  The patient was informed of very high blood pressure readings, highest 160/122.  He was instructed to contact his cardiologist to follow-up on this issue, as he may be placed on a higher risk for stroke. Alcohol cessation recommended  Check hearing Agree with sleep studies, rule out sleep apnea that has insomnia  Subjective:   The patient is accompanied by his wife who supplements the history.   How long did patient have memory difficulties? The doctor noticed, nothing major.  His wife reports that this has been present for the last 1 year.  Reports some difficulty remembering new information, conversations and names.  Long-term memory is good. repeats oneself?  Endorsed Disoriented when walking into a room?  Patient denies  except occasionally not remembering what patient came to the room for    Leaving objects in unusual places? Denies.   Wandering behavior?  denies .  Any personality changes?  Denies.  He responds to questions with jokes.  It is unclear if this is a personality change, although the answers may not be appropriate at times.   Any history of depression?:  Denies   Hallucinations or paranoia?  Denies   Seizures?  Denies    Any sleep changes?  Has been experiencing insomnia it was worse 2 months ago .  He tried several agents, currently on trazodone .  Patient for a sleep study soon. Denies vivid dreams, REM behavior or sleepwalking   Sleep apnea?  Denies   Any hygiene concerns?  Denies   Independent of bathing and dressing?  Endorsed  Does the patient needs help with medications? Patient is in charge  Who is in charge of the finances? Patient is in charge    Any changes in appetite?  Denies    Patient have trouble swallowing? Denies.   Does the patient cook? He does not cook Not much. Any kitchen accidents such as leaving the stove on? Denies.   Any history of headaches?   Denies.   Chronic pain ? Denies.   Ambulates with difficulty?  Denies.  Recent falls or head injuries? Denies.   Vision changes? Denies.   Unilateral weakness, numbness or tingling? 1 year ago around the time that his memory issues began, he had  the worse headache but did not seek medical attention entertain going to the ER but I did.  He never had  any studies or films at the time     Any tremors?   Denies.   Any anosmia?  Denies.   Any incontinence of urine? Denies.   Any bowel dysfunction? Denies.      Patient lives with wife   History of heavy alcohol intake? Endorsed.  He reports drinking 2 glasses of wine, Bourbon, no beer  History of heavy tobacco use? Denies.   Family history of dementia? Mo and MGF dementia ? type Does patient drive? Yes   Retired from government social research officer.  Past Medical History:  Diagnosis  Date   Arthritis    Cancer (HCC)    hx skin cancer   Gout    History of skin cancer    New onset atrial fibrillation (HCC) 03/13/2023   Nocturnal leg cramps    early in am- different parts of legs at different times. Relieved by mustard and ambulation     Past Surgical History:  Procedure Laterality Date   ANKLE ARTHROTOMY  10/08/1991   BILATERAL ANTERIOR TOTAL HIP ARTHROPLASTY Bilateral 09/02/2014   Procedure: BILATERAL ANTERIOR TOTAL HIP ARTHROPLASTY;  Surgeon: Lonni CINDERELLA Poli, MD;  Location: [facility];  Service: Orthopedics;  Laterality: Bilateral;   JOINT REPLACEMENT     KNEE ARTHROSCOPY  10/08/1991     Allergies  Allergen Reactions   Adhesive [Tape] Rash   Doxycycline  Rash   Penicillins Rash   Scopolamine  Rash   Tetracyclines & Related Rash    Current Outpatient Medications  Medication Instructions   allopurinol  (ZYLOPRIM ) 100 MG tablet Take 1&1/2 tablets (150 mg total) by mouth daily.   allopurinol  (ZYLOPRIM ) 100 MG tablet Take 1&1/2 tablets (150 mg total) by mouth daily.   apixaban  (ELIQUIS ) 5 mg, Oral, 2 times daily   benzonatate  (TESSALON ) 100-200 mg, Oral, 3 times daily PRN   fluocinonide  cream (LIDEX ) 0.05 % Apply topically 2 (two) times daily to affected area as directed   fluorouracil  (EFUDEX ) 5 % cream Apply topically 2 (two) times daily as directed   imiquimod  (ALDARA ) 5 % cream Apply 1 application to lesion every day for 12 weeks   oxyCODONE -acetaminophen  (ROXICET) 5-325 MG per tablet 1-2 tablets, Oral, Every 4 hours PRN   promethazine -dextromethorphan (PROMETHAZINE -DM) 6.25-15 MG/5ML syrup 5 mLs, Oral, Daily at bedtime   sildenafil  (REVATIO ) 20 MG tablet Take 2-3 tablets (40-60 mg total) by mouth as needed & as directed   sildenafil  (REVATIO ) 20 MG tablet Take 4 tablets (80 mg total) by mouth daily as needed as directed   sildenafil  (REVATIO ) 20 MG tablet Take 4 tablets (80 mg total) by mouth once daily as needed as directed.    sulfamethoxazole -trimethoprim  (BACTRIM  DS) 800-160 MG tablet Take 1 tablet by mouth twice a day   traZODone  (DESYREL ) 50 MG tablet Take 1/2-1 tablet (25-50 mg total) by mouth at bedtime as needed for sleep.     VITALS:   Vitals:   11/11/23 1302  BP: (!) 162/113  Pulse: 98  Resp: 20  SpO2: 98%  Weight: 195 lb (88.5 kg)  Height: 5' 10 (1.778 m)      PHYSICAL EXAM   HEENT:  Normocephalic, atraumatic. The superficial temporal arteries are without ropiness or tenderness. Cardiovascular: Irregularly irregular rate and rhythm. Lungs: Clear to auscultation bilaterally. Neck: There are no carotid bruits noted bilaterally.  NEUROLOGICAL:    11/11/2023    1:00 PM  Montreal Cognitive Assessment   Visuospatial/ Executive (0/5) 1  Naming (0/3) 3  Attention: Read list of digits (0/2) 2  Attention: Read list of letters (0/1) 1  Attention: Serial 7 subtraction starting at 100 (0/3) 3  Language: Repeat phrase (0/2) 2  Language : Fluency (0/1) 1  Abstraction (0/2) 2  Delayed Recall (0/5) 0  Orientation (0/6) 6  Total 21  Adjusted Score (based on education) 21        No data to display           Orientation:  Alert and oriented to person, not to place and time. No aphasia or dysarthria. Fund of knowledge is appropriate. Recent and remote memory impaired.  Attention and concentration are normal.  Able to name objects and repeat phrases. Delayed recall 0/5 Cranial nerves: There is good facial symmetry. Extraocular muscles are intact and visual fields are full to confrontational testing. Speech is fluent and clear. No tongue deviation. Hearing is intact to conversational tone. Tone: Tone is good throughout. Sensation: Sensation is intact to light touch. Vibration is intact at the bilateral big toe.  Coordination: The patient has no difficulty with RAM's or FNF bilaterally. Normal finger to nose  Motor: Strength is 5/5 in the bilateral upper and lower extremities. There is no pronator  drift. There are no fasciculations noted. DTR's: Deep tendon reflexes are 2/4 bilaterally. Gait and Station: The patient is able to ambulate without difficulty The patient is able to heel toe walk . Gait is cautious and narrow. The patient is able to ambulate in a tandem fashion.       Thank you for allowing us  the opportunity to participate in the care of this nice patient. Please do not hesitate to contact us  for any questions or concerns.   Total time spent on today's visit was 60 minutes dedicated to this patient today, preparing to see patient, examining the patient, ordering tests and/or medications and counseling the patient, documenting clinical information in the EHR or other health record, independently interpreting results and communicating results to the patient/family, discussing treatment and goals, answering patient's questions and coordinating care.  Cc:  Elnor Lauraine BRAVO, NP  Camie Sevin 11/11/2023 1:46 PM

## 2023-11-12 NOTE — Progress Notes (Signed)
 Will hold and wait on other b1 level to come back.

## 2023-11-12 NOTE — Progress Notes (Signed)
 vitamin B12 is low.  Recommend starting on vitamin B12 1000 mcg daily.  Follow-up this lab with primary doctor. Awaiting B1, can take another day or two  Thank you

## 2023-11-14 ENCOUNTER — Telehealth: Payer: Self-pay | Admitting: Physician Assistant

## 2023-11-14 DIAGNOSIS — B081 Molluscum contagiosum: Secondary | ICD-10-CM | POA: Diagnosis not present

## 2023-11-14 NOTE — Telephone Encounter (Signed)
 I advised patient to start b12 1000mcg.

## 2023-11-14 NOTE — Telephone Encounter (Signed)
 Patient called to speak with christy. He said christy called and  mentioned something about b12. I don't see a message so I wasn't sure what she needs. Wife wants you to call her back because Dawayne is confused

## 2023-11-17 ENCOUNTER — Other Ambulatory Visit (HOSPITAL_COMMUNITY): Payer: Self-pay

## 2023-11-17 LAB — VITAMIN B12: Vitamin B-12: 336 pg/mL (ref 200–1100)

## 2023-11-17 LAB — VITAMIN B1: Vitamin B1 (Thiamine): 16 nmol/L (ref 8–30)

## 2023-11-17 MED ORDER — ZYLET 0.5-0.3 % OP SUSP
1.0000 [drp] | Freq: Three times a day (TID) | OPHTHALMIC | 0 refills | Status: DC
Start: 2023-11-17 — End: 2024-04-28
  Filled 2023-11-17: qty 5, 30d supply, fill #0
  Filled 2023-11-17: qty 5, 34d supply, fill #0

## 2023-11-17 NOTE — Progress Notes (Signed)
 B1 is normal thank you

## 2023-11-18 ENCOUNTER — Other Ambulatory Visit (HOSPITAL_COMMUNITY): Payer: Self-pay

## 2023-11-19 ENCOUNTER — Telehealth: Payer: Self-pay | Admitting: Orthopaedic Surgery

## 2023-11-19 NOTE — Telephone Encounter (Signed)
Pt called stating he needed to talk to Dr Magnus Ivan, would only state he's having severe pain and that's it. I asked where and he said Dr Magnus Ivan knows me ask him to call.

## 2023-11-20 ENCOUNTER — Other Ambulatory Visit (HOSPITAL_COMMUNITY): Payer: Self-pay

## 2023-11-20 ENCOUNTER — Other Ambulatory Visit: Payer: Self-pay | Admitting: Orthopaedic Surgery

## 2023-11-20 MED ORDER — TIZANIDINE HCL 4 MG PO TABS
4.0000 mg | ORAL_TABLET | Freq: Three times a day (TID) | ORAL | 0 refills | Status: AC | PRN
  Filled 2023-11-20: qty 30, 10d supply, fill #0

## 2023-11-25 ENCOUNTER — Encounter: Payer: Self-pay | Admitting: Internal Medicine

## 2023-11-25 ENCOUNTER — Ambulatory Visit: Payer: Medicare PPO | Attending: Internal Medicine | Admitting: Internal Medicine

## 2023-11-25 ENCOUNTER — Other Ambulatory Visit (HOSPITAL_COMMUNITY): Payer: Self-pay

## 2023-11-25 VITALS — BP 138/90 | HR 84 | Ht 71.0 in | Wt 196.0 lb

## 2023-11-25 DIAGNOSIS — I4891 Unspecified atrial fibrillation: Secondary | ICD-10-CM | POA: Diagnosis not present

## 2023-11-25 MED ORDER — DILTIAZEM HCL 30 MG PO TABS
30.0000 mg | ORAL_TABLET | Freq: Four times a day (QID) | ORAL | 3 refills | Status: AC | PRN
  Filled 2023-11-25: qty 90, 23d supply, fill #0

## 2023-11-25 NOTE — Patient Instructions (Signed)
 Medication Instructions:  Start Diltiazem 30 mg take every 6 hours as needed for palpitations Continue all other medications *If you need a refill on your cardiac medications before your next appointment, please call your pharmacy*   Lab Work: None ordered   Testing/Procedures: Echo  first available    Follow-Up: At [facility], you and your health needs are our priority.  As part of our continuing mission to provide you with exceptional heart care, we have created designated Provider Care Teams.  These Care Teams include your primary Cardiologist (physician) and Advanced Practice Providers (APPs -  Physician Assistants and Nurse Practitioners) who all work together to provide you with the care you need, when you need it.  We recommend signing up for the patient portal called "MyChart".  Sign up information is provided on this After Visit Summary.  MyChart is used to connect with patients for Virtual Visits (Telemedicine).  Patients are able to view lab/test results, encounter notes, upcoming appointments, etc.  Non-urgent messages can be sent to your provider as well.   To learn more about what you can do with MyChart, go to [email or web address].    Your next appointment:  3 months   Monday 5/19 at 1:30 pm     Provider:  [facility]    Main Entrance take Elevators on left to 6th floor

## 2023-11-25 NOTE — Progress Notes (Signed)
  Cardiology Office Note:  .   Date:  11/25/2023  ID:  Goldie Dimmer, DOB [birth year], MRN [id number] PCP: Elenore Paddy, NP  [facility] Providers Cardiologist:  None    History of Present Illness: .   Jose Robles is a 78 y.o. male 78 y.o. year old male with a history of hypertension, hyperlipidemia , cognitive impairment may be in the setting of EtOH, who is being referred for new onset atypical atrial flutter.  He follows with neurology for memory impairment.  He has an MRI pending.  He is otherwise asymptomatic from a cardiac perspective. He has no cardiac hx. He is here with his wife.  ROS:  per HPI otherwise negative   Studies Reviewed: Marland Kitchen   EKG Interpretation Date/Time:  Tuesday November 25 2023 15:02:24 EST Ventricular Rate:  84 PR Interval:    QRS Duration:  134 QT Interval:  394 QTC Calculation: 465 R Axis:   74  Text Interpretation: Atrial flutter with variable A-V block Right bundle Neyra Pettie block No previous ECGs available Confirmed by Carolan Clines ([id number]) on 11/25/2023 3:17:53 PM     Risk Assessment/Calculations:    CHA2DS2-VASc Score = 3   This indicates a 3.2% annual risk of stroke. The patient's score is based upon: CHF History: 0 HTN History: 1 Diabetes History: 0 Stroke History: 0 Vascular Disease History: 0 Age Score: 2 Gender Score: 0          Physical Exam:   VS:   Vitals:   11/25/23 1455  BP: (!) 138/90  Pulse: 84    Wt Readings from Last 3 Encounters:  11/25/23 196 lb (88.9 kg)  11/11/23 195 lb (88.5 kg)  09/25/23 196 lb 4 oz (89 kg)    GEN: Well nourished, well developed in no acute distress NECK: No JVD CARDIAC: RRR, no murmurs, rubs, gallops RESPIRATORY:  Clear to auscultation without rales, wheezing or rhonchi  ABDOMEN: Soft, non-tender, non-distended EXTREMITIES:  No edema; No deformity   ASSESSMENT AND PLAN: .   Atrial flutter 4:1 AV block Appears to be atypical, from EKG in December 2024.  He has normal thyroid  function.  ETOH can contribute to this as well. - asymptomatic - He is in rate controlled atrial flutter today - start dilt 30 mg Q6H PRN -Continue Eliquis 5 mg twice daily -Will plan for an echocardiogram -I discussed cardioversion with him, he was hesitant. If he is asymptomatic may just rate control him.       Dispo: Follow up with Afib clinic in 3 months  Signed, Edger Husain, Alben Spittle, MD

## 2023-11-26 ENCOUNTER — Encounter: Payer: Self-pay | Admitting: Physician Assistant

## 2023-11-26 ENCOUNTER — Other Ambulatory Visit: Payer: Medicare PPO

## 2023-11-28 ENCOUNTER — Other Ambulatory Visit (HOSPITAL_COMMUNITY): Payer: Self-pay

## 2023-12-01 ENCOUNTER — Other Ambulatory Visit (INDEPENDENT_AMBULATORY_CARE_PROVIDER_SITE_OTHER): Payer: Self-pay

## 2023-12-01 ENCOUNTER — Ambulatory Visit: Payer: Medicare PPO | Admitting: Orthopaedic Surgery

## 2023-12-01 ENCOUNTER — Encounter: Payer: Self-pay | Admitting: Orthopaedic Surgery

## 2023-12-01 DIAGNOSIS — M545 Low back pain, unspecified: Secondary | ICD-10-CM

## 2023-12-01 DIAGNOSIS — G8929 Other chronic pain: Secondary | ICD-10-CM

## 2023-12-01 NOTE — Progress Notes (Signed)
 The patient is a 78 year old gentleman who comes in due to a flareup of left-sided low back pain.  He had a slip and fall on ice when his pain was hurting more on the right side and then recently on the left side.  However he says now he is pain-free.  When he did call we sent in some tizanidine for him.  We have replaced both of his hips back in 2015.  He denies any pain today at all.  He denies any radicular symptoms.  He points to the right side of his lumbar spine as were the source of his pain was earlier.  He has negative straight leg raise bilaterally and he does not appear uncomfortable.  2 views lumbar spine shows some degenerative changes at multiple levels but no malalignment and no evidence of compression fracture.  Since he is doing well and is essentially not have any discomfort or pain, I am not recommending anything other than intermittent ice or heat in the muscle relaxant but only as needed.  If he does have a flareup of pain we would recommend outpatient physical therapy and even consider facet joint injection with Dr. Alvester Morin to the right side if he is having pain and difficulty.  Thus far though follow-up can be as needed since he is doing well.

## 2023-12-03 ENCOUNTER — Other Ambulatory Visit: Payer: Medicare PPO

## 2023-12-03 ENCOUNTER — Ambulatory Visit: Payer: Medicare PPO | Admitting: Orthopaedic Surgery

## 2023-12-03 NOTE — Telephone Encounter (Signed)
 Mri brain wo contrast cancelled 12/03/2023. Patient has appt with sara 02/10/2024. FYI

## 2023-12-04 ENCOUNTER — Other Ambulatory Visit (HOSPITAL_COMMUNITY): Payer: Self-pay

## 2023-12-10 ENCOUNTER — Encounter: Payer: Self-pay | Admitting: Nurse Practitioner

## 2023-12-17 ENCOUNTER — Other Ambulatory Visit (HOSPITAL_COMMUNITY): Payer: Self-pay

## 2023-12-22 ENCOUNTER — Ambulatory Visit: Payer: Medicare PPO | Admitting: Orthopaedic Surgery

## 2023-12-30 ENCOUNTER — Ambulatory Visit (HOSPITAL_COMMUNITY)
Admission: RE | Admit: 2023-12-30 | Payer: Medicare PPO | Source: Ambulatory Visit | Attending: Internal Medicine | Admitting: Internal Medicine

## 2024-01-02 ENCOUNTER — Other Ambulatory Visit (HOSPITAL_COMMUNITY): Payer: Self-pay

## 2024-01-14 ENCOUNTER — Other Ambulatory Visit (HOSPITAL_COMMUNITY): Payer: Self-pay

## 2024-01-16 ENCOUNTER — Other Ambulatory Visit (HOSPITAL_COMMUNITY): Payer: Self-pay

## 2024-02-10 ENCOUNTER — Ambulatory Visit: Payer: Medicare PPO | Admitting: Physician Assistant

## 2024-02-17 DIAGNOSIS — M1A09X0 Idiopathic chronic gout, multiple sites, without tophus (tophi): Secondary | ICD-10-CM | POA: Diagnosis not present

## 2024-02-18 ENCOUNTER — Other Ambulatory Visit: Payer: Self-pay

## 2024-02-18 ENCOUNTER — Other Ambulatory Visit (HOSPITAL_COMMUNITY): Payer: Self-pay

## 2024-02-23 ENCOUNTER — Other Ambulatory Visit (HOSPITAL_COMMUNITY): Payer: Self-pay

## 2024-02-23 ENCOUNTER — Ambulatory Visit (HOSPITAL_COMMUNITY)
Admission: RE | Admit: 2024-02-23 | Discharge: 2024-02-23 | Disposition: A | Discharge status: Home / Self Care | Payer: Medicare PPO | Source: Ambulatory Visit | Attending: Physician Assistant | Admitting: Physician Assistant

## 2024-02-23 ENCOUNTER — Encounter (HOSPITAL_COMMUNITY): Payer: Self-pay | Admitting: Physician Assistant

## 2024-02-23 VITALS — BP 140/98 | HR 70 | Ht 71.0 in | Wt 199.0 lb

## 2024-02-23 DIAGNOSIS — I484 Atypical atrial flutter: Secondary | ICD-10-CM

## 2024-02-23 DIAGNOSIS — I4891 Unspecified atrial fibrillation: Secondary | ICD-10-CM

## 2024-02-23 DIAGNOSIS — D6869 Other thrombophilia: Secondary | ICD-10-CM

## 2024-02-23 NOTE — Progress Notes (Addendum)
 Primary Care Physician: Zorita Hiss, NP Primary Cardiologist: None Electrophysiologist: None  Referring Physician: Dr Alois Arnt   Jose Robles is a 78 y.o. male with a history of HTN, HLD, atrial flutter who presents for follow up in the [facility].  The patient was initially diagnosed with atrial flutter 03/13/23 after presenting to his PCP to establish care.  Incidentally discovered, ECG showed rate controlled atrial flutter with variable block. He was started on Eliquis  for stroke prevention. He was seen by Dr Amanda Jungling 11/25/23 and DCCV was discussed, patient hesitant to proceed at that time.    Patient presents today for follow up for atrial flutter. He remains in rate controlled atrial flutter today, asymptomatic. No bleeding issues on anticoagulation. He reports that he has discontinued drinking alcohol. He does snore.   Today, he denies symptoms of palpitations, chest pain, shortness of breath, orthopnea, PND, lower extremity edema, dizziness, presyncope, syncope, bleeding, or neurologic sequela. The patient is tolerating medications without difficulties and is otherwise without complaint today.    Atrial Fibrillation Risk Factors:  he does have symptoms or diagnosis of sleep apnea. he does not have a history of rheumatic fever. he does have a history of alcohol use. The patient does not have a history of early familial atrial fibrillation or other arrhythmias.  Atrial Fibrillation Management history:  Previous antiarrhythmic drugs: none Previous cardioversions: none Previous ablations: none Anticoagulation history: Eliquis   ROS- All systems are reviewed and negative except as per the HPI above.  Past Medical History:  Diagnosis Date   Arthritis    Cancer (HCC)    hx skin cancer   Gout    History of skin cancer    New onset atrial fibrillation (HCC) 03/13/2023   Nocturnal leg cramps    early in am- different parts of legs at different  times. Relieved by mustard and ambulation    Current Outpatient Medications  Medication Sig Dispense Refill   allopurinol  (ZYLOPRIM ) 100 MG tablet Take 1&1/2 tablets (150 mg total) by mouth daily. 135 tablet 3   allopurinol  (ZYLOPRIM ) 100 MG tablet Take 1.5 tablets (150 mg total) by mouth daily. 135 tablet 3   apixaban  (ELIQUIS ) 5 MG TABS tablet Take 1 tablet (5 mg total) by mouth 2 (two) times daily. 60 tablet 3   benzonatate  (TESSALON ) 100 MG capsule Take 1-2 capsules (100-200 mg total) by mouth 3 (three) times daily as needed. 30 capsule 0   diltiazem  (CARDIZEM ) 30 MG tablet Take 1 tablet (30 mg total) by mouth every 6 (six) hours as needed for palpitations. 90 tablet 3   fluocinonide  cream (LIDEX ) 0.05 % Apply topically 2 (two) times daily to affected area as directed 60 g 0   fluorouracil  (EFUDEX ) 5 % cream Apply topically 2 (two) times daily as directed 40 g 0   imiquimod  (ALDARA ) 5 % cream Apply 1 application to lesion every day for 12 weeks 24 each 0   Loteprednol -Tobramycin  (ZYLET ) 0.5-0.3 % SUSP Instill 1 drop into left eye three times a day 5 mL 0   oxyCODONE -acetaminophen  (ROXICET) 5-325 MG per tablet Take 1-2 tablets by mouth every 4 (four) hours as needed. 60 tablet 0   promethazine -dextromethorphan (PROMETHAZINE -DM) 6.25-15 MG/5ML syrup Take 5 mLs by mouth at bedtime. 118 mL 0   sildenafil  (REVATIO ) 20 MG tablet Take 2-3 tablets (40-60 mg total) by mouth as needed & as directed 100 tablet 5   sildenafil  (REVATIO ) 20 MG tablet Take 4 tablets (80  mg total) by mouth daily as needed as directed 120 tablet PRN   sildenafil  (REVATIO ) 20 MG tablet Take 4 tablets (80 mg total) by mouth once daily as needed as directed. 120 tablet PRN   sulfamethoxazole -trimethoprim  (BACTRIM  DS) 800-160 MG tablet Take 1 tablet by mouth twice a day 14 tablet 0   tiZANidine  (ZANAFLEX ) 4 MG tablet Take 1 tablet (4 mg total) by mouth every 8 (eight) hours as needed for muscle spasms. 30 tablet 0   traZODone   (DESYREL ) 50 MG tablet Take 1/2-1 tablet (25-50 mg total) by mouth at bedtime as needed for sleep. 30 tablet 0   No current facility-administered medications for this encounter.    Physical Exam: BP (!) 140/98   Pulse 70   Ht 5\' 11"  (1.803 m)   Wt 90.3 kg   BMI 27.75 kg/m   GEN: Well nourished, well developed in no acute distress NECK: No JVD CARDIAC: Irregularly irregular rate and rhythm, no murmurs, rubs, gallops RESPIRATORY:  Clear to auscultation without rales, wheezing or rhonchi  ABDOMEN: Soft, non-tender, non-distended EXTREMITIES:  No edema; No deformity   Wt Readings from Last 3 Encounters:  02/23/24 90.3 kg  11/25/23 88.9 kg  11/11/23 88.5 kg     EKG today demonstrates  Atypical atrial flutter with variable block Vent. rate 70 BPM PR interval * ms QRS duration 128 ms QT/QTcB 404/436 ms    CHA2DS2-VASc Score = 3  The patient's score is based upon: CHF History: 0 HTN History: 1 Diabetes History: 0 Stroke History: 0 Vascular Disease History: 0 Age Score: 2 Gender Score: 0       ASSESSMENT AND PLAN: Atypical Atrial flutter The patient's CHA2DS2-VASc score is 3, indicating a 3.2% annual risk of stroke.   General education about atrial flutter provided and questions answered. We also discussed his stroke risk and the risks and benefits of anticoagulation. Check echocardiogram Continue Eliquis  5 mg BID Continue diltiazem  30 mg PRN for heart racing We discussed rate vs rhythm control. If his EF looks good on echo, he would prefer a rate control strategy.  Patient deferred sleep evaluation.   Secondary Hypercoagulable State (ICD10:  D68.69) The patient is at significant risk for stroke/thromboembolism based upon his CHA2DS2-VASc Score of 3.  Continue Apixaban  (Eliquis ). No bleeding issues.   HTN Stable on current regimen    Follow up in the AF clinic in 6-8 weeks.        Myrtha Ates PA-C Afib Clinic [facility] [address] [place], [place] [address] [phone]

## 2024-02-23 NOTE — Patient Instructions (Signed)
 Echocardiogram -- scheduling will call once insurance authorization received.

## 2024-02-24 DIAGNOSIS — E663 Overweight: Secondary | ICD-10-CM | POA: Diagnosis not present

## 2024-02-24 DIAGNOSIS — M1A09X0 Idiopathic chronic gout, multiple sites, without tophus (tophi): Secondary | ICD-10-CM | POA: Diagnosis not present

## 2024-02-24 DIAGNOSIS — M1991 Primary osteoarthritis, unspecified site: Secondary | ICD-10-CM | POA: Diagnosis not present

## 2024-02-24 DIAGNOSIS — R7989 Other specified abnormal findings of blood chemistry: Secondary | ICD-10-CM | POA: Diagnosis not present

## 2024-02-24 DIAGNOSIS — Z6828 Body mass index (BMI) 28.0-28.9, adult: Secondary | ICD-10-CM | POA: Diagnosis not present

## 2024-02-24 DIAGNOSIS — R252 Cramp and spasm: Secondary | ICD-10-CM | POA: Diagnosis not present

## 2024-02-27 ENCOUNTER — Other Ambulatory Visit (HOSPITAL_COMMUNITY): Payer: Medicare PPO

## 2024-03-12 ENCOUNTER — Other Ambulatory Visit (HOSPITAL_COMMUNITY): Payer: Self-pay

## 2024-03-15 ENCOUNTER — Other Ambulatory Visit: Payer: Self-pay

## 2024-03-23 ENCOUNTER — Other Ambulatory Visit: Payer: Self-pay | Admitting: Nurse Practitioner

## 2024-03-23 ENCOUNTER — Other Ambulatory Visit (HOSPITAL_COMMUNITY): Payer: Self-pay

## 2024-03-23 DIAGNOSIS — G4700 Insomnia, unspecified: Secondary | ICD-10-CM

## 2024-03-23 MED ORDER — TRAZODONE HCL 50 MG PO TABS
25.0000 mg | ORAL_TABLET | Freq: Every evening | ORAL | 0 refills | Status: AC | PRN
  Filled 2024-03-23: qty 30, 30d supply, fill #0

## 2024-03-24 ENCOUNTER — Other Ambulatory Visit (HOSPITAL_COMMUNITY): Payer: Self-pay

## 2024-04-12 DIAGNOSIS — E291 Testicular hypofunction: Secondary | ICD-10-CM | POA: Diagnosis not present

## 2024-04-13 ENCOUNTER — Ambulatory Visit (HOSPITAL_COMMUNITY)
Admission: RE | Admit: 2024-04-13 | Discharge: 2024-04-13 | Disposition: A | Discharge status: Home / Self Care | Source: Ambulatory Visit | Attending: Cardiology | Admitting: Cardiology

## 2024-04-13 DIAGNOSIS — I4891 Unspecified atrial fibrillation: Secondary | ICD-10-CM | POA: Insufficient documentation

## 2024-04-13 LAB — ECHOCARDIOGRAM COMPLETE
Area-P 1/2: 3.99 cm2
P 1/2 time: 714 ms
S' Lateral: 2.5 cm

## 2024-04-14 ENCOUNTER — Ambulatory Visit (HOSPITAL_COMMUNITY): Payer: Self-pay | Admitting: Physician Assistant

## 2024-04-28 ENCOUNTER — Ambulatory Visit (HOSPITAL_COMMUNITY)
Admission: RE | Admit: 2024-04-28 | Discharge: 2024-04-28 | Disposition: A | Discharge status: Home / Self Care | Source: Ambulatory Visit | Attending: Physician Assistant | Admitting: Physician Assistant

## 2024-04-28 VITALS — BP 150/90 | HR 52 | Ht 71.0 in | Wt 200.0 lb

## 2024-04-28 DIAGNOSIS — D6869 Other thrombophilia: Secondary | ICD-10-CM

## 2024-04-28 DIAGNOSIS — I4891 Unspecified atrial fibrillation: Secondary | ICD-10-CM | POA: Diagnosis not present

## 2024-04-28 DIAGNOSIS — I484 Atypical atrial flutter: Secondary | ICD-10-CM | POA: Diagnosis not present

## 2024-04-28 NOTE — Progress Notes (Signed)
 Primary Care Physician: Elnor Lauraine BRAVO, NP Primary Cardiologist: None Electrophysiologist: None  Referring Physician: Dr Ronal Ross   Jose Robles is a 78 y.o. male with a history of HTN, HLD, atrial flutter who presents for follow up in the [facility].  The patient was initially diagnosed with atrial flutter 03/13/23 after presenting to his PCP to establish care.  Incidentally discovered, ECG showed rate controlled atrial flutter with variable block. He was started on Eliquis  for stroke prevention. He was seen by Dr Ross 11/25/23 and DCCV was discussed, patient hesitant to proceed at that time.    Patient returns for follow up for atrial flutter. He is back in SR today. He reports that he felt no different in atrial flutter vs SR. He had an echo which showed preserved EF 55-60%, mild MR. No bleeding issues on anticoagulation.   Today, he  denies symptoms of palpitations, chest pain, shortness of breath, orthopnea, PND, lower extremity edema, dizziness, presyncope, syncope, bleeding, or neurologic sequela. The patient is tolerating medications without difficulties and is otherwise without complaint today.    Atrial Fibrillation Risk Factors:  he does have symptoms or diagnosis of sleep apnea. Patient deferred sleep study.  he does not have a history of rheumatic fever. he does have a history of alcohol use. The patient does not have a history of early familial atrial fibrillation or other arrhythmias.  Atrial Fibrillation Management history:  Previous antiarrhythmic drugs: none Previous cardioversions: none Previous ablations: none Anticoagulation history: Eliquis   ROS- All systems are reviewed and negative except as per the HPI above.  Past Medical History:  Diagnosis Date   Arthritis    Cancer (HCC)    hx skin cancer   Gout    History of skin cancer    New onset atrial fibrillation (HCC) 03/13/2023   Nocturnal leg cramps    early in am-  different parts of legs at different times. Relieved by mustard and ambulation    Current Outpatient Medications  Medication Sig Dispense Refill   allopurinol  (ZYLOPRIM ) 100 MG tablet Take 1.5 tablets (150 mg total) by mouth daily. 135 tablet 3   apixaban  (ELIQUIS ) 5 MG TABS tablet Take 1 tablet (5 mg total) by mouth 2 (two) times daily. 60 tablet 3   fluorouracil  (EFUDEX ) 5 % cream Apply topically 2 (two) times daily as directed (Patient taking differently: Apply topically as needed.) 40 g 0   oxyCODONE -acetaminophen  (ROXICET) 5-325 MG per tablet Take 1-2 tablets by mouth every 4 (four) hours as needed. (Patient taking differently: Take 1-2 tablets by mouth as needed.) 60 tablet 0   promethazine -dextromethorphan (PROMETHAZINE -DM) 6.25-15 MG/5ML syrup Take 5 mLs by mouth at bedtime. (Patient taking differently: Take 5 mLs by mouth as needed.) 118 mL 0   sildenafil  (REVATIO ) 20 MG tablet Take 4 tablets (80 mg total) by mouth daily as needed as directed (Patient taking differently: Take 80 mg by mouth as needed.) 120 tablet PRN   tadalafil (CIALIS) 5 MG tablet Take 5 mg by mouth. (Patient taking differently: Take 5 mg by mouth as needed.)     tiZANidine  (ZANAFLEX ) 4 MG tablet Take 1 tablet (4 mg total) by mouth every 8 (eight) hours as needed for muscle spasms. (Patient taking differently: Take 4 mg by mouth as needed for muscle spasms.) 30 tablet 0   traZODone  (DESYREL ) 50 MG tablet Take 1/2-1 tablet (25-50 mg total) by mouth at bedtime as needed for sleep. 30 tablet 0  diltiazem  (CARDIZEM ) 30 MG tablet Take 1 tablet (30 mg total) by mouth every 6 (six) hours as needed for palpitations. (Patient not taking: Reported on 04/28/2024) 90 tablet 3   No current facility-administered medications for this encounter.    Physical Exam: BP (!) 150/90   Pulse (!) 52   Ht 5' 11 (1.803 m)   Wt 90.7 kg   BMI 27.89 kg/m   GEN: Well nourished, well developed in no acute distress CARDIAC: Regular rate and  rhythm, no murmurs, rubs, gallops RESPIRATORY:  Clear to auscultation without rales, wheezing or rhonchi  ABDOMEN: Soft, non-tender, non-distended EXTREMITIES:  No edema; No deformity    Wt Readings from Last 3 Encounters:  04/28/24 90.7 kg  02/23/24 90.3 kg  11/25/23 88.9 kg     EKG today demonstrates  SB, RBBB Vent. rate 52 BPM PR interval 136 ms QRS duration 130 ms QT/QTcB 472/438 ms   Echo 04/13/24  1. Left ventricular ejection fraction, by estimation, is 55 to 60%. The  left ventricle has normal function. The left ventricle has no regional  wall motion abnormalities. Left ventricular diastolic function could not  be evaluated.   2. Right ventricular systolic function is normal. The right ventricular  size is normal. Mildly increased right ventricular wall thickness. There  is normal pulmonary artery systolic pressure.   3. Right atrial size was mildly dilated.   4. The mitral valve is degenerative. Mild mitral valve regurgitation. No  evidence of mitral stenosis.   5. The aortic valve is tricuspid. Aortic valve regurgitation is trivial.  Aortic valve sclerosis is present, with no evidence of aortic valve  stenosis.   6. The inferior vena cava is normal in size with greater than 50%  respiratory variability, suggesting right atrial pressure of 3 mmHg.     CHA2DS2-VASc Score = 3  The patient's score is based upon: CHF History: 0 HTN History: 1 Diabetes History: 0 Stroke History: 0 Vascular Disease History: 0 Age Score: 2 Gender Score: 0       ASSESSMENT AND PLAN: Atypical atrial flutter The patient's CHA2DS2-VASc score is 3, indicating a 3.2% annual risk of stroke.   Patient in SR today. We discussed rate vs rhythm control. He did not feel any different in atrial flutter and his LVEF is normal. He does not want to pursue any rhythm control at this point. Would pursue a conservative rate control strategy.  Continue Eliquis  5 mg BID Continue diltiazem  30 mg PRN  for heart racing  Secondary Hypercoagulable State (ICD10:  D68.69) The patient is at significant risk for stroke/thromboembolism based upon his CHA2DS2-VASc Score of 3.  Continue Apixaban  (Eliquis ). No bleeding issues.   HTN Mildly elevated today, he will follow up with his PCP.   Follow up in the AF clinic as needed. Will refer him to get established with a new cardiologist in 6 months.       Daril Kicks PA-C Afib Clinic [facility] [address] [place], [place] [address] [phone]

## 2024-04-30 ENCOUNTER — Ambulatory Visit: Payer: Self-pay

## 2024-04-30 NOTE — Telephone Encounter (Signed)
 FYI Only or Action Required?: FYI only for provider.  Patient was last seen in primary care on 09/25/2023 by Elnor Lauraine BRAVO, NP.  Called Nurse Triage reporting Sinusitis.  Symptoms began about a month ago.  Interventions attempted: Nothing.  Symptoms are: gradually worsening.  Triage Disposition: See PCP When Office is Open (Within 3 Days)  Patient/caregiver understands and will follow disposition?: Yes   Offered appt for today, pt states he can't come today and requested Monday.      Copied from CRM [id number]. Topic: Clinical - Red Word Triage >> Apr 30, 2024  8:58 AM Rosina BIRCH wrote: Reason for RMF:dpwld issues and green sputum that is coming out of his nose Reason for Disposition  [1] Sinus congestion (pressure, fullness) AND [2] present > 10 days  Answer Assessment - Initial Assessment Questions 1. LOCATION: Where does it hurt?      No pain, just congestion 2. ONSET: When did the sinus pain start?  (e.g., hours, days)      A couple months 3. SEVERITY: How bad is the pain?   (Scale 0-10; or none, mild, moderate or severe)     none 4. RECURRENT SYMPTOM: Have you ever had sinus problems before? If Yes, ask: When was the last time? and What happened that time?      yes 5. NASAL CONGESTION: Is the nose blocked? If Yes, ask: Can you open it or must you breathe through your mouth?     Can breathe through nose 6. NASAL DISCHARGE: Do you have discharge from your nose? If so ask, What color?     Drainage is green 7. FEVER: Do you have a fever? If Yes, ask: What is it, how was it measured, and when did it start?      no 8. OTHER SYMPTOMS: Do you have any other symptoms? (e.g., sore throat, cough, earache, difficulty breathing)     no  Protocols used: Sinus Pain or Congestion-A-AH

## 2024-05-03 ENCOUNTER — Ambulatory Visit: Payer: PRIVATE HEALTH INSURANCE | Admitting: Family Medicine

## 2024-05-07 ENCOUNTER — Other Ambulatory Visit: Payer: Self-pay | Admitting: Nurse Practitioner

## 2024-05-07 ENCOUNTER — Other Ambulatory Visit (HOSPITAL_COMMUNITY): Payer: Self-pay

## 2024-05-07 DIAGNOSIS — I4891 Unspecified atrial fibrillation: Secondary | ICD-10-CM

## 2024-05-07 MED ORDER — APIXABAN 5 MG PO TABS
5.0000 mg | ORAL_TABLET | Freq: Two times a day (BID) | ORAL | 3 refills | Status: AC
  Filled 2024-05-07: qty 60, 30d supply, fill #0
  Filled 2024-05-24 – 2024-05-31 (×2): qty 60, 30d supply, fill #1
  Filled 2024-09-22: qty 60, 30d supply, fill #2

## 2024-05-08 ENCOUNTER — Other Ambulatory Visit: Payer: Self-pay

## 2024-05-09 ENCOUNTER — Other Ambulatory Visit (HOSPITAL_COMMUNITY): Payer: Self-pay

## 2024-05-17 ENCOUNTER — Telehealth: Payer: Self-pay

## 2024-05-17 DIAGNOSIS — L918 Other hypertrophic disorders of the skin: Secondary | ICD-10-CM

## 2024-05-17 NOTE — Telephone Encounter (Signed)
 Copied from CRM [id number]. Topic: General - Other >> May 17, 2024  4:00 PM Thersia BROCKS wrote: Reason for CRM: Patient called in regarding wanting to speak with NP Lauraine Pereyra would like a callback   [phone]

## 2024-05-24 ENCOUNTER — Other Ambulatory Visit (HOSPITAL_COMMUNITY): Payer: Self-pay

## 2024-05-26 NOTE — Addendum Note (Signed)
 Addended by: LEAR RONNALD SQUIBB on: 05/26/2024 03:41 PM   Modules accepted: Orders

## 2024-05-26 NOTE — Addendum Note (Signed)
 Addended by: LEAR RONNALD SQUIBB on: 05/26/2024 03:49 PM   Modules accepted: Orders

## 2024-05-26 NOTE — Telephone Encounter (Addendum)
 Called pt and ask what was needed, pt wanted a referral to be placed to [facility]. Jadine, MD office

## 2024-05-31 ENCOUNTER — Other Ambulatory Visit (HOSPITAL_COMMUNITY): Payer: Self-pay

## 2024-06-03 ENCOUNTER — Ambulatory Visit: Admitting: Physician Assistant

## 2024-06-29 ENCOUNTER — Other Ambulatory Visit (HOSPITAL_COMMUNITY): Payer: Self-pay

## 2024-06-30 ENCOUNTER — Other Ambulatory Visit (HOSPITAL_COMMUNITY): Payer: Self-pay

## 2024-07-05 ENCOUNTER — Other Ambulatory Visit (HOSPITAL_COMMUNITY): Payer: Self-pay

## 2024-07-28 DIAGNOSIS — H2513 Age-related nuclear cataract, bilateral: Secondary | ICD-10-CM | POA: Diagnosis not present

## 2024-07-28 DIAGNOSIS — H04123 Dry eye syndrome of bilateral lacrimal glands: Secondary | ICD-10-CM | POA: Diagnosis not present

## 2024-07-28 DIAGNOSIS — H17822 Peripheral opacity of cornea, left eye: Secondary | ICD-10-CM | POA: Diagnosis not present

## 2024-07-28 DIAGNOSIS — H35371 Puckering of macula, right eye: Secondary | ICD-10-CM | POA: Diagnosis not present

## 2024-08-03 NOTE — Progress Notes (Incomplete)
 Assessment/Plan:    Memory impairment likely of multiple etiologies  Jose Robles is a very pleasant 78 y.o. RH male with a history of hypertension, hyperlipidemia, HOH, atrial fibrillation, alcohol abuse, insomnia presenting today in follow-up for evaluation of memory loss.  Etiology unclear, likely multifactorial.  He continues to perform his ADLs and to drive.  He is not on antidementia medication at this time, will consider it pending on the upcoming neurocognitive testing in January 2026.   Recommendations:   Follow up in   months. Recommend hearing evaluation to improve comprehension Discontinue alcohol.  Counseling provided  Replenish B12 (336) Agree with sleep studies, rule out sleep apnea.  Recommend good control of cardiovascular risk factors Continue to control mood as per PCP Monitor driving    Subjective:   This patient is accompanied in the office by his wife***  who supplements the history. Previous records as well as any outside records available were reviewed prior to todays visit.   Patient was last seen on 11/11/2023 with [facility] 21/30***.    Any changes in memory since last visit? Jose Robles  LTM is good repeats oneself?  Endorsed Disoriented when walking into a room?  Patient denies ***  Misplacing objects?  Patient denies   Wandering behavior?   Denies. Any personality changes since last visit? Denies.  He responds to questions with jokes.  Sometimes his answers are not appropriate Any worsening depression?: denies.   Hallucinations or paranoia?  Denies.   Seizures?   Denies.    Any sleep changes?  Does not sleep very well, history of insomnia.  He takes trazodone  with some help.***.   Denies vivid dreams, REM behavior or sleepwalking   Sleep apnea?   denies ***  Any hygiene concerns?   Denies.   Independent of bathing and dressing?  Endorsed  Does the patient needs help with medications? Patient is in charge *** Who is in charge of the finances?  Patient is in  charge   *** Any changes in appetite?  denies ***   Patient have trouble swallowing?  Denies.   Does the patient cook?  Not much any kitchen accidents such as leaving the stove on?   Denies.   Any headaches?    Denies.   Vision changes? Denies. Chronic pain?  Denies.   Ambulates with difficulty?    Denies. ***  Recent falls or head injuries?    Denies.      Unilateral weakness, numbness or tingling?  Denies.   Any tremors?  Denies.   Any anosmia?    Denies.   Any incontinence of urine?  Denies.   Any bowel dysfunction?  Denies.      Patient lives .*** Does the patient drive?*** Alcohol he drinks?  2 glasses of wine, bourbon, but no beer  Initial visit 11/11/23  how long did patient have memory difficulties? The doctor noticed, nothing major.  His wife reports that this has been present for the last 1 year.  Reports some difficulty remembering new information, conversations and names.  Long-term memory is good. repeats oneself?  Endorsed Disoriented when walking into a room?  Patient denies except occasionally not remembering what patient came to the room for    Leaving objects in unusual places? Denies.   Wandering behavior?  denies .  Any personality changes?  Denies.  He responds to questions with jokes.  It is unclear if this is a personality change, although the answers may not be appropriate at times.  Any history of depression?:  Denies   Hallucinations or paranoia?  Denies   Seizures?  Denies    Any sleep changes?  Has been experiencing insomnia it was worse 2 months ago .  He tried several agents, currently on trazodone .  Patient for a sleep study soon. Denies vivid dreams, REM behavior or sleepwalking   Sleep apnea?  Denies   Any hygiene concerns?  Denies   Independent of bathing and dressing?  Endorsed  Does the patient needs help with medications? Patient is in charge  Who is in charge of the finances? Patient is in charge    Any changes in appetite?  Denies     Patient have trouble swallowing? Denies.   Does the patient cook? He does not cook Not much. Any kitchen accidents such as leaving the stove on? Denies.   Any history of headaches?   Denies.   Chronic pain ? Denies.   Ambulates with difficulty?  Denies.  Recent falls or head injuries? Denies.   Vision changes? Denies.   Unilateral weakness, numbness or tingling? 1 year ago around the time that his memory issues began, he had  the worse headache but did not seek medical attention entertain going to the ER but I did.  He never had any studies or films at the time     Any tremors?   Denies.   Any anosmia?  Denies.   Any incontinence of urine? Denies.   Any bowel dysfunction? Denies.      Patient lives with wife   History of heavy alcohol intake? Endorsed.  He reports drinking 2 glasses of wine, Bourbon, no beer  History of heavy tobacco use? Denies.   Family history of dementia? Mo and MGF dementia ? type Does patient drive? Yes   Retired from [employer]. Past Medical History:  Diagnosis Date   Arthritis    Cancer (HCC)    hx skin cancer   Gout    History of skin cancer    New onset atrial fibrillation (HCC) 03/13/2023   Nocturnal leg cramps    early in am- different parts of legs at different times. Relieved by mustard and ambulation     Past Surgical History:  Procedure Laterality Date   ANKLE ARTHROTOMY  10/08/1991   BILATERAL ANTERIOR TOTAL HIP ARTHROPLASTY Bilateral 09/02/2014   Procedure: BILATERAL ANTERIOR TOTAL HIP ARTHROPLASTY;  Surgeon: Lonni CINDERELLA Poli, MD;  Location: [facility];  Service: Orthopedics;  Laterality: Bilateral;   JOINT REPLACEMENT     KNEE ARTHROSCOPY  10/08/1991     PREVIOUS MEDICATIONS:   CURRENT MEDICATIONS:  Outpatient Encounter Medications as of 08/04/2024  Medication Sig   allopurinol  (ZYLOPRIM ) 100 MG tablet Take 1.5 tablets (150 mg total) by mouth daily.   apixaban  (ELIQUIS ) 5 MG TABS tablet Take 1 tablet (5 mg total) by  mouth 2 (two) times daily.   diltiazem  (CARDIZEM ) 30 MG tablet Take 1 tablet (30 mg total) by mouth every 6 (six) hours as needed for palpitations. (Patient not taking: Reported on 04/28/2024)   fluorouracil  (EFUDEX ) 5 % cream Apply topically 2 (two) times daily as directed (Patient taking differently: Apply topically as needed.)   oxyCODONE -acetaminophen  (ROXICET) 5-325 MG per tablet Take 1-2 tablets by mouth every 4 (four) hours as needed. (Patient taking differently: Take 1-2 tablets by mouth as needed.)   promethazine -dextromethorphan (PROMETHAZINE -DM) 6.25-15 MG/5ML syrup Take 5 mLs by mouth at bedtime. (Patient taking differently: Take 5 mLs by mouth as needed.)   sildenafil  (  REVATIO ) 20 MG tablet Take 4 tablets (80 mg total) by mouth daily as needed as directed (Patient taking differently: Take 80 mg by mouth as needed.)   tadalafil (CIALIS) 5 MG tablet Take 5 mg by mouth. (Patient taking differently: Take 5 mg by mouth as needed.)   tiZANidine  (ZANAFLEX ) 4 MG tablet Take 1 tablet (4 mg total) by mouth every 8 (eight) hours as needed for muscle spasms. (Patient taking differently: Take 4 mg by mouth as needed for muscle spasms.)   traZODone  (DESYREL ) 50 MG tablet Take 1/2-1 tablet (25-50 mg total) by mouth at bedtime as needed for sleep.   No facility-administered encounter medications on file as of 08/04/2024.     Objective:     PHYSICAL EXAMINATION:    VITALS:  There were no vitals filed for this visit.  GEN:  The patient appears stated age and is in NAD. HEENT:  Normocephalic, atraumatic.   Neurological examination:  General: NAD, well-groomed, appears stated age. Orientation: The patient is alert. Oriented to person, place and not to date.*** Cranial nerves: There is good facial symmetry.The speech is fluent and clear. No aphasia or dysarthria. Fund of knowledge is appropriate. Recent memory impaired and remote memory is normal.  Attention and concentration are normal.  Able to  name objects and repeat phrases.  Hearing is intact to conversational tone ***.   Delayed recall *** Sensation: Sensation is intact to light touch throughout Motor: Strength is at least antigravity x4. DTR's 2/4 in UE/LE      11/11/2023    1:00 PM  Montreal Cognitive Assessment   Visuospatial/ Executive (0/5) 1  Naming (0/3) 3  Attention: Read list of digits (0/2) 2  Attention: Read list of letters (0/1) 1  Attention: Serial 7 subtraction starting at 100 (0/3) 3  Language: Repeat phrase (0/2) 2  Language : Fluency (0/1) 1  Abstraction (0/2) 2  Delayed Recall (0/5) 0  Orientation (0/6) 6  Total 21  Adjusted Score (based on education) 21        No data to display             Movement examination: Tone: There is normal tone in the UE/LE Abnormal movements:  no tremor.  No myoclonus.  No asterixis.   Coordination:  There is no decremation with RAM's. Normal finger to nose  Gait and Station: The patient has no difficulty arising out of a deep-seated chair without the use of the hands. The patient's stride length is good.  Gait is cautious and narrow.   Thank you for allowing us  the opportunity to participate in the care of this nice patient. Please do not hesitate to contact us  for any questions or concerns.   Total time spent on today's visit was *** minutes dedicated to this patient today, preparing to see patient, examining the patient, ordering tests and/or medications and counseling the patient, documenting clinical information in the EHR or other health record, independently interpreting results and communicating results to the patient/family, discussing treatment and goals, answering patient's questions and coordinating care.  Cc:  Elnor Lauraine BRAVO, NP  Camie Sevin 08/03/2024 6:31 AM

## 2024-08-04 ENCOUNTER — Ambulatory Visit: Admitting: Physician Assistant

## 2024-08-09 ENCOUNTER — Encounter: Payer: Self-pay | Admitting: Radiology

## 2024-08-17 DIAGNOSIS — M1A09X0 Idiopathic chronic gout, multiple sites, without tophus (tophi): Secondary | ICD-10-CM | POA: Diagnosis not present

## 2024-08-25 DIAGNOSIS — E663 Overweight: Secondary | ICD-10-CM | POA: Diagnosis not present

## 2024-08-25 DIAGNOSIS — R252 Cramp and spasm: Secondary | ICD-10-CM | POA: Diagnosis not present

## 2024-08-25 DIAGNOSIS — M1A09X0 Idiopathic chronic gout, multiple sites, without tophus (tophi): Secondary | ICD-10-CM | POA: Diagnosis not present

## 2024-08-25 DIAGNOSIS — Z6829 Body mass index (BMI) 29.0-29.9, adult: Secondary | ICD-10-CM | POA: Diagnosis not present

## 2024-08-25 DIAGNOSIS — M1991 Primary osteoarthritis, unspecified site: Secondary | ICD-10-CM | POA: Diagnosis not present

## 2024-08-25 DIAGNOSIS — R7989 Other specified abnormal findings of blood chemistry: Secondary | ICD-10-CM | POA: Diagnosis not present

## 2024-09-22 ENCOUNTER — Other Ambulatory Visit: Payer: Self-pay | Admitting: Family

## 2024-09-22 ENCOUNTER — Other Ambulatory Visit (HOSPITAL_COMMUNITY): Payer: Self-pay

## 2024-09-22 MED ORDER — SILDENAFIL CITRATE 20 MG PO TABS
80.0000 mg | ORAL_TABLET | Freq: Every day | ORAL | 99 refills | Status: AC | PRN
  Filled 2024-09-22: qty 120, 30d supply, fill #0

## 2024-10-19 ENCOUNTER — Ambulatory Visit: Payer: Self-pay

## 2024-10-19 ENCOUNTER — Institutional Professional Consult (permissible substitution): Payer: Medicare PPO | Admitting: Psychology

## 2024-11-02 ENCOUNTER — Encounter: Payer: Medicare PPO | Admitting: Psychology

## 2024-11-16 ENCOUNTER — Ambulatory Visit: Admitting: Physician Assistant
